# Patient Record
Sex: Male | Born: 1980 | Race: White | Hispanic: No | Marital: Single | Smoking: Current every day smoker
Health system: Southern US, Community
[De-identification: ages and names within clinical notes are randomized; demographics above are authoritative.]

## PROBLEM LIST (undated history)

## (undated) DIAGNOSIS — I10 Essential (primary) hypertension: Secondary | ICD-10-CM

## (undated) DIAGNOSIS — F419 Anxiety disorder, unspecified: Secondary | ICD-10-CM

## (undated) DIAGNOSIS — Z720 Tobacco use: Secondary | ICD-10-CM

## (undated) DIAGNOSIS — R000 Tachycardia, unspecified: Principal | ICD-10-CM

## (undated) HISTORY — PX: HERNIA REPAIR: SHX51

## (undated) HISTORY — DX: Essential (primary) hypertension: I10

## (undated) HISTORY — DX: Tobacco use: Z72.0

## (undated) HISTORY — DX: Anxiety disorder, unspecified: F41.9

## (undated) HISTORY — DX: Tachycardia, unspecified: R00.0

---

## 2016-05-29 ENCOUNTER — Encounter (HOSPITAL_COMMUNITY): Payer: Self-pay

## 2016-05-29 ENCOUNTER — Emergency Department (HOSPITAL_COMMUNITY)
Admission: EM | Admit: 2016-05-29 | Discharge: 2016-05-30 | Disposition: A | Discharge status: Home / Self Care | Payer: Managed Care, Other (non HMO) | Attending: Emergency Medicine | Admitting: Emergency Medicine

## 2016-05-29 DIAGNOSIS — Z79899 Other long term (current) drug therapy: Secondary | ICD-10-CM | POA: Diagnosis not present

## 2016-05-29 DIAGNOSIS — E860 Dehydration: Secondary | ICD-10-CM | POA: Diagnosis not present

## 2016-05-29 DIAGNOSIS — F17210 Nicotine dependence, cigarettes, uncomplicated: Secondary | ICD-10-CM | POA: Insufficient documentation

## 2016-05-29 DIAGNOSIS — R531 Weakness: Secondary | ICD-10-CM

## 2016-05-29 DIAGNOSIS — R55 Syncope and collapse: Secondary | ICD-10-CM | POA: Diagnosis present

## 2016-05-29 NOTE — ED Triage Notes (Signed)
Pt BIB [facility] from home. Pt reports he has been havin episodes of being dizzy, lightheaded, and weak. He reports going to his PCP on Thursday and they prescribed him Paxil. He reports he started this on Friday and feels like it has put him in a daze. He thinks he is having some anxiety. He reports what made him call EMS tonight was a near syncopal episode coming back from [place]. Denies cardiac history but reports he has felt his heart feeling "weird and fast". Opioid hx with current every day Suboxone use.

## 2016-05-29 NOTE — ED Provider Notes (Signed)
[facility]ENC[facility] DEPT Provider Note   CSN: [id number] Arrival date & time: 05/29/16  2249  By signing my name below, I, Elder Negus, attest that this documentation has been prepared under the direction and in the presence of Azalia Bilis, MD. Electronically Signed: Elder Negus, Scribe. 05/29/16. 11:41 PM.   History   Chief Complaint Chief Complaint  Patient presents with  . Near Syncope    HPI Nicholas Clark is a 36 y.o. male without any chronic medical problems who presents to the ED for evaluation of near syncope. This patient states that for the 7 days he has experienced "foggy thinking" (not disoriented) with occasional heart palpitations and paresthesias in the face, hands. No chest pain. Today, he was walking from the restroom when he became acutely lightheaded and "nearly passed out". He denies any loss of consciousness. No focal weaknesses or loss of sensation. No cough, vomiting, diarrhea. No rectal bleeding. No abdominal pain. He has had normal fluid intake recently. He denies any active lightheadedness at interview. He was started on Paxil 2 days ago for anxiety by primary care. Also takes suboxone.   The history is provided by the patient. No language interpreter was used.    History reviewed. No pertinent past medical history.  There are no active problems to display for this patient.   Past Surgical History:  Procedure Laterality Date  . HERNIA REPAIR     pt reports he was around 2 yr        Home Medications    Prior to Admission medications   Medication Sig Start Date End Date Taking? Authorizing Provider  PARoxetine (PAXIL-CR) 25 MG 24 hr tablet Take 25 mg by mouth daily. 05/26/16  Yes Historical Provider, MD  SUBOXONE 12-3 MG FILM Place 1 Film under the tongue daily.  05/24/16  Yes Historical Provider, MD    Family History No family history on file.  Social History Social History  Substance Use Topics  . Smoking status: Current Every Day  Smoker    Packs/day: 1.50    Years: 20.00    Types: Cigarettes  . Smokeless tobacco: Current User  . Alcohol use No     Allergies   Patient has no allergy information on record.   Review of Systems Review of Systems  All systems reviewed and are negative for acute change except as noted in the HPI.  Physical Exam Updated Vital Signs BP (!) 145/91 (BP Location: Left Arm)   Pulse 100   Temp 99.2 F (37.3 C) (Oral)   Resp 10   Ht  (1.778 m)   Wt 240 lb (108.9 kg)   SpO2 98%   BMI 34.44 kg/m   Physical Exam   ED Treatments / Results  Labs (all labs ordered are listed, but only abnormal results are displayed) Labs Reviewed  CBC - Abnormal; Notable for the following:       Result Value   WBC 13.0 (*)    All other components within normal limits  COMPREHENSIVE METABOLIC PANEL - Abnormal; Notable for the following:    Chloride 100 (*)    Glucose, Bld 114 (*)    Total Protein 6.4 (*)    All other components within normal limits  CBG MONITORING, ED - Abnormal; Notable for the following:    Glucose-Capillary 101 (*)    All other components within normal limits  TSH    EKG  EKG Interpretation  Date/Time:  Sunday May 29 2016 22:54:42 EDT Ventricular Rate:  105  PR Interval:    QRS Duration: 96 QT Interval:  327 QTC Calculation: 433 R Axis:   17 Text Interpretation:  Sinus tachycardia No old tracing to compare Confirmed by Pansy Ostrovsky  MD, Caryn Bee ([id number]) on 05/29/2016 11:30:29 PM       Radiology No results found.  Procedures Procedures (including critical care time)  Medications Ordered in ED Medications  sodium chloride 0.9 % bolus 1,000 mL (0 mLs Intravenous Stopped 05/30/16 0224)     Initial Impression / Assessment and Plan / ED Course  I have reviewed the triage vital signs and the nursing notes.  Pertinent labs & imaging results that were available during my care of the patient were reviewed by me and considered in my medical decision making  (see chart for details).     Patient feels better after IV fluids at this time.  No weakness of the arms or legs.  Doubt stroke.  Doubt ACS.  Doubt PE.  Well-appearing.  Discharge home with primary care follow-up.  Final Clinical Impressions(s) / ED Diagnoses   Final diagnoses:  Dehydration  Weakness    New Prescriptions New Prescriptions   No medications on file  I personally performed the services described in this documentation, which was scribed in my presence. The recorded information has been reviewed and is accurate.       Azalia Bilis, MD 05/30/16 [phone]

## 2016-05-30 LAB — COMPREHENSIVE METABOLIC PANEL
ALBUMIN: 3.9 g/dL (ref 3.5–5.0)
ALT: 22 U/L (ref 17–63)
ANION GAP: 9 (ref 5–15)
AST: 17 U/L (ref 15–41)
Alkaline Phosphatase: 49 U/L (ref 38–126)
BUN: 14 mg/dL (ref 6–20)
CHLORIDE: 100 mmol/L — AB (ref 101–111)
CO2: 27 mmol/L (ref 22–32)
Calcium: 9 mg/dL (ref 8.9–10.3)
Creatinine, Ser: 1.08 mg/dL (ref 0.61–1.24)
GFR calc Af Amer: >60 mL/min (ref >60)
GFR calc non Af Amer: >60 mL/min (ref >60)
GLUCOSE: 114 mg/dL — AB (ref 65–99)
POTASSIUM: 4 mmol/L (ref 3.5–5.1)
Sodium: 136 mmol/L (ref 135–145)
TOTAL PROTEIN: 6.4 g/dL — AB (ref 6.5–8.1)
Total Bilirubin: 0.4 mg/dL (ref 0.3–1.2)

## 2016-05-30 LAB — CBC
HEMATOCRIT: 42.4 % (ref 39.0–52.0)
HEMOGLOBIN: 14.4 g/dL (ref 13.0–17.0)
MCH: 28.1 pg (ref 26.0–34.0)
MCHC: 34 g/dL (ref 30.0–36.0)
MCV: 82.7 fL (ref 78.0–100.0)
PLATELETS: 217 10*3/uL (ref 150–400)
RBC: 5.13 MIL/uL (ref 4.22–5.81)
RDW: 12.9 % (ref 11.5–15.5)
WBC: 13 10*3/uL — AB (ref 4.0–10.5)

## 2016-05-30 LAB — CBG MONITORING, ED: Glucose-Capillary: 101 mg/dL — ABNORMAL HIGH (ref 65–99)

## 2016-05-30 LAB — TSH: TSH: 1.158 u[IU]/mL (ref 0.350–4.500)

## 2016-05-30 MED ORDER — SODIUM CHLORIDE 0.9 % IV BOLUS (SEPSIS)
1000.0000 mL | Freq: Once | INTRAVENOUS | Status: AC
  Administered 2016-05-30: 1000 mL via INTRAVENOUS

## 2016-05-30 NOTE — ED Notes (Signed)
Pt stated he did get a little dizzy going from sitting to standing but pt did well sitting and standing.

## 2016-05-30 NOTE — Discharge Instructions (Signed)
Please contact your doctor for additional recommendations

## 2016-06-09 ENCOUNTER — Other Ambulatory Visit: Payer: Self-pay

## 2016-06-09 ENCOUNTER — Encounter (HOSPITAL_COMMUNITY): Payer: Self-pay

## 2016-06-09 ENCOUNTER — Emergency Department (HOSPITAL_COMMUNITY)
Admission: EM | Admit: 2016-06-09 | Discharge: 2016-06-10 | Disposition: A | Discharge status: Home / Self Care | Payer: Managed Care, Other (non HMO) | Attending: Emergency Medicine | Admitting: Emergency Medicine

## 2016-06-09 ENCOUNTER — Emergency Department (HOSPITAL_COMMUNITY): Payer: Managed Care, Other (non HMO)

## 2016-06-09 DIAGNOSIS — E860 Dehydration: Secondary | ICD-10-CM | POA: Insufficient documentation

## 2016-06-09 DIAGNOSIS — F17210 Nicotine dependence, cigarettes, uncomplicated: Secondary | ICD-10-CM | POA: Diagnosis not present

## 2016-06-09 DIAGNOSIS — Z79899 Other long term (current) drug therapy: Secondary | ICD-10-CM | POA: Diagnosis not present

## 2016-06-09 DIAGNOSIS — R002 Palpitations: Secondary | ICD-10-CM | POA: Diagnosis present

## 2016-06-09 LAB — I-STAT TROPONIN, ED: TROPONIN I, POC: 0 ng/mL (ref 0.00–0.08)

## 2016-06-09 LAB — BASIC METABOLIC PANEL
Anion gap: 12 (ref 5–15)
BUN: 17 mg/dL (ref 6–20)
CALCIUM: 9.8 mg/dL (ref 8.9–10.3)
CO2: 29 mmol/L (ref 22–32)
CREATININE: 1.35 mg/dL — AB (ref 0.61–1.24)
Chloride: 93 mmol/L — ABNORMAL LOW (ref 101–111)
GFR calc Af Amer: >60 mL/min (ref >60)
GLUCOSE: 159 mg/dL — AB (ref 65–99)
Potassium: 3.1 mmol/L — ABNORMAL LOW (ref 3.5–5.1)
Sodium: 134 mmol/L — ABNORMAL LOW (ref 135–145)

## 2016-06-09 LAB — CBC
HCT: 45.4 % (ref 39.0–52.0)
Hemoglobin: 15.9 g/dL (ref 13.0–17.0)
MCH: 28.5 pg (ref 26.0–34.0)
MCHC: 35 g/dL (ref 30.0–36.0)
MCV: 81.5 fL (ref 78.0–100.0)
PLATELETS: 211 10*3/uL (ref 150–400)
RBC: 5.57 MIL/uL (ref 4.22–5.81)
RDW: 12.6 % (ref 11.5–15.5)
WBC: 10.6 10*3/uL — ABNORMAL HIGH (ref 4.0–10.5)

## 2016-06-09 MED ORDER — POTASSIUM CHLORIDE CRYS ER 20 MEQ PO TBCR
40.0000 meq | EXTENDED_RELEASE_TABLET | Freq: Once | ORAL | Status: AC
Start: 2016-06-09 — End: 2016-06-09
  Administered 2016-06-09: 40 meq via ORAL
  Filled 2016-06-09: qty 2

## 2016-06-09 MED ORDER — SODIUM CHLORIDE 0.9 % IV BOLUS (SEPSIS)
1000.0000 mL | Freq: Once | INTRAVENOUS | Status: AC
  Administered 2016-06-10: 1000 mL via INTRAVENOUS

## 2016-06-09 NOTE — ED Provider Notes (Signed)
[facility] Provider Note   CSN: [id number] Arrival date & time: 06/09/16  2134     History   Chief Complaint Chief Complaint  Patient presents with  . Chest Pain    HPI Nicholas Clark is a 36 y.o. male.  Patient with PMH of anxiety, palpitations, and recovering from drug abuse presents to the ED with a chief complaint of palpitations.  He states that for the past several weeks he has had intermittent palpitations.  He associates it with eating.  He states that he has some pain that radiates to bilateral shoulders.  He denies any associated fevers, chills, cough, or SOB.  He states that he has been seen by his PCP and was started on metoprolol, klonopin, and HCTZ.  He is attempting to schedule an appointment with the cardiologist.    The history is provided by the patient. No language interpreter was used.    History reviewed. No pertinent past medical history.  There are no active problems to display for this patient.   Past Surgical History:  Procedure Laterality Date  . HERNIA REPAIR     pt reports he was around 2 yr        Home Medications    Prior to Admission medications   Medication Sig Start Date End Date Taking? Authorizing Provider  PARoxetine (PAXIL-CR) 25 MG 24 hr tablet Take 25 mg by mouth daily. 05/26/16   Historical Provider, MD  SUBOXONE 12-3 MG FILM Place 1 Film under the tongue daily.  05/24/16   Historical Provider, MD    Family History No family history on file.  Social History Social History  Substance Use Topics  . Smoking status: Current Every Day Smoker    Packs/day: 1.50    Years: 20.00    Types: Cigarettes  . Smokeless tobacco: Current User  . Alcohol use No     Allergies   Patient has no known allergies.   Review of Systems Review of Systems  All other systems reviewed and are negative.    Physical Exam Updated Vital Signs BP (!) 137/97 (BP Location: Left Arm)   Pulse (!) 118   Temp 98.6 F (37 C)   Resp 18    SpO2 99%   Physical Exam  Constitutional: He is oriented to person, place, and time. He appears well-developed and well-nourished.  HENT:  Head: Normocephalic and atraumatic.  Eyes: Conjunctivae and EOM are normal. Pupils are equal, round, and reactive to light. Right eye exhibits no discharge. Left eye exhibits no discharge. No scleral icterus.  Neck: Normal range of motion. Neck supple. No JVD present.  Cardiovascular: Normal rate, regular rhythm and normal heart sounds.  Exam reveals no gallop and no friction rub.   No murmur heard. tachycardic  Pulmonary/Chest: Effort normal and breath sounds normal. No respiratory distress. He has no wheezes. He has no rales. He exhibits no tenderness.  Abdominal: Soft. He exhibits no distension and no mass. There is no tenderness. There is no rebound and no guarding.  Musculoskeletal: Normal range of motion. He exhibits no edema or tenderness.  No leg swelling or tenderness  Neurological: He is alert and oriented to person, place, and time.  Skin: Skin is warm and dry.  Psychiatric: He has a normal mood and affect. His behavior is normal. Judgment and thought content normal.  Nursing note and vitals reviewed.    ED Treatments / Results  Labs (all labs ordered are listed, but only abnormal results are displayed) Labs Reviewed  BASIC METABOLIC PANEL - Abnormal; Notable for the following:       Result Value   Sodium 134 (*)    Potassium 3.1 (*)    Chloride 93 (*)    Glucose, Bld 159 (*)    Creatinine, Ser 1.35 (*)    All other components within normal limits  CBC - Abnormal; Notable for the following:    WBC 10.6 (*)    All other components within normal limits  D-DIMER, QUANTITATIVE (NOT AT [facility])  Rosezena Sensor, ED    EKG  EKG Interpretation  Date/Time:  Thursday Jun 09 2016 23:44:55 EDT Ventricular Rate:  90 PR Interval:    QRS Duration: 103 QT Interval:  342 QTC Calculation: 419 R Axis:   -3 Text Interpretation:  Sinus  rhythm No significant change since last tracing Confirmed by Zadie Rhine ([id number]) on 06/10/2016 12:07:45 AM       Radiology Dg Chest 2 View  Result Date: 06/09/2016 CLINICAL DATA:  Intermittent chest pain tonight, radiating down the left arm. EXAM: CHEST  2 VIEW COMPARISON:  None. FINDINGS: The lungs are clear. The pulmonary vasculature is normal. Heart size is normal. Hilar and mediastinal contours are unremarkable. There is no pleural effusion. IMPRESSION: No active cardiopulmonary disease. Electronically Signed   By: Ellery Plunk M.D.   On: 06/09/2016 22:15    Procedures Procedures (including critical care time)  Medications Ordered in ED Medications  sodium chloride 0.9 % bolus 1,000 mL (not administered)  potassium chloride SA (K-DUR,KLOR-CON) CR tablet 40 mEq (40 mEq Oral Given 06/09/16 2331)     Initial Impression / Assessment and Plan / ED Course  I have reviewed the triage vital signs and the nursing notes.  Pertinent labs & imaging results that were available during my care of the patient were reviewed by me and considered in my medical decision making (see chart for details).     Patient with intermittent palpitations.  Unclear etiology.  Recently seen for the same.  TSH normal.  Started on metoprolol by PCP with some relief, but not complete.  Has discontinued stimulants such as caffeine.  Is working on getting cardiology follow-up.  Laboratory workup is reassuring. D-dimer is negative. Troponin is normal. Potassium repleted.  Suspect some element of dehydration.  Feels improved after a liter of fluids.  Will discharge to home with cardiology follow-up.  Patient understands and agrees with the plan.  Final Clinical Impressions(s) / ED Diagnoses   Final diagnoses:  Palpitations  Dehydration    New Prescriptions Discharge Medication List as of 06/10/2016  2:50 AM       Roxy Horseman, PA-C 06/10/16 2952    Geoffery Lyons, MD 06/10/16 [phone]

## 2016-06-09 NOTE — ED Triage Notes (Signed)
Pt reports left sided chest pain with heart palpitations ongoing for a couple of days and unable to get an appt with a cardiologist. He reports feeling dizziness but has not passed out. He just started taking Metoprolol on Tuesday by PCP. HR 118 at triage.

## 2016-06-10 LAB — D-DIMER, QUANTITATIVE: D-Dimer, Quant: 0.41 ug/mL-FEU (ref 0.00–0.50)

## 2016-06-10 NOTE — ED Notes (Signed)
No chest pain

## 2016-06-10 NOTE — ED Notes (Signed)
No pain

## 2016-06-10 NOTE — ED Notes (Signed)
Pt ambulated steady down the hallway, pt states he "felt a touch dizzy" pt ambulated independently and denied SOB

## 2016-06-13 ENCOUNTER — Telehealth: Payer: Self-pay | Admitting: Cardiovascular Disease

## 2016-06-13 NOTE — Telephone Encounter (Signed)
lmsg for pt to call.  Need to confirm his appt for tomorrow with Dr. Duke Salviaandolph.  Did not leave detailed message.  No DPR on file.

## 2016-06-14 ENCOUNTER — Encounter: Payer: Self-pay | Admitting: Cardiovascular Disease

## 2016-06-14 ENCOUNTER — Ambulatory Visit (INDEPENDENT_AMBULATORY_CARE_PROVIDER_SITE_OTHER): Payer: Managed Care, Other (non HMO) | Admitting: Cardiovascular Disease

## 2016-06-14 VITALS — BP 124/72 | HR 128 | Ht 70.0 in | Wt 230.0 lb

## 2016-06-14 DIAGNOSIS — Z720 Tobacco use: Secondary | ICD-10-CM

## 2016-06-14 DIAGNOSIS — F419 Anxiety disorder, unspecified: Secondary | ICD-10-CM | POA: Diagnosis not present

## 2016-06-14 DIAGNOSIS — I10 Essential (primary) hypertension: Secondary | ICD-10-CM | POA: Diagnosis not present

## 2016-06-14 DIAGNOSIS — R000 Tachycardia, unspecified: Secondary | ICD-10-CM | POA: Diagnosis not present

## 2016-06-14 HISTORY — DX: Essential (primary) hypertension: I10

## 2016-06-14 HISTORY — DX: Tachycardia, unspecified: R00.0

## 2016-06-14 HISTORY — DX: Tobacco use: Z72.0

## 2016-06-14 HISTORY — DX: Anxiety disorder, unspecified: F41.9

## 2016-06-14 MED ORDER — LISINOPRIL 20 MG PO TABS: 20.0000 mg | ORAL_TABLET | Freq: Every day | ORAL | 5 refills | Status: DC

## 2016-06-14 MED ORDER — METOPROLOL TARTRATE 50 MG PO TABS: 50.0000 mg | ORAL_TABLET | Freq: Two times a day (BID) | ORAL | 5 refills | Status: DC

## 2016-06-14 NOTE — Patient Instructions (Addendum)
Medication Instructions:  STOP HYDROCHLOROTHIAZIDE   INCREASE YOUR METOPROLOL TO 50 MG TWICE A DAY   START LISINOPRIL 20 MG DAILY   Labwork: NONE  Testing/Procedures: NONE  Follow-Up: Your physician recommends that you schedule a follow-up appointment in: 3 WEEKS   If you need a refill on your cardiac medications before your next appointment, please call your pharmacy.

## 2016-06-14 NOTE — Progress Notes (Signed)
Cardiology Office Note   Date:  06/14/2016   ID:  Nicholas Clark, DOB Feb 10, 1980, MRN [id number]  PCP:  System, Pcp Not In  Cardiologist:   Chilton Si, MD   Chief Complaint  Patient presents with  . New Patient (Initial Visit)    high blood pressure, high pulse and tightness in chest, having some dizziness      History of Present Illness: Nicholas Clark is a 36 y.o. male with anxiety, palpitations and prior drug abuse who presents for an evaluation of palpitations at the request of Nicholas Clark.  Nicholas Clark reports three weeks of palpitations.  Prior to that he was started on Paxil for anxiety.  However, he had to stop it because of side effects.  Two days later he was seen in the ED with near syncope on 05/29/16.  BP at the time was 145/91 and heart rate was 100 bpm.  He received 1 L of IV fluid and followed up with his PCP. At that time he was noted to be hypertensive and was started on hydrochlorothiazide. He followed up and was hypokalemic and reported palpitations. He was started on metoprolol 25 mg twice daily.  However he was again seen in the ED 06/09/16 with palpitations.  EKG revealed sinus tachycardia at 126 bpm and QTc 579 ms.  Cardiac enzymes, TSH and d-dimer were normal.  He received 1L IVF and was discharged with plans for follow up with cardiology as an outpatient.  Since then his BP at home has been 97-125/67-84 and his heart rate has ranged from 72-111. He thinks that the metoprolol is helping. He also thinks that caffeine as a trigger for his palpitations.  Nicholas Clark has noted fast heart rates in the past, but it typically occurs when he is stressed or anxious.  For the last few weeks it starts unprovoked.  He stop drinking caffeine, limited salt and sugar intake but has not noted any improvement in the symptoms.  The palpitations are associated with lightheadedness and he sometimes feels like he is going to pass out.  There is no associated shortness of  breath, but he sometimes get a momentary dull pain in his chest.    Nicholas Clark does not get any formal exercise. However he works at [employer] and does a lot of walking and climbing ladders. He has no exertional symptoms.  He smokes one pack of cigarettes daily and is ready to quit.  He hasn't ever tried in the past.  He denies alcohol or illicit drug use.  He doesn't use any OTC cold or cough medication.   Past Medical History:  Diagnosis Date  . Anxiety 06/14/2016  . Essential hypertension 06/14/2016  . Sinus tachycardia 06/14/2016  . Tobacco abuse 06/14/2016    Past Surgical History:  Procedure Laterality Date  . HERNIA REPAIR     pt reports he was around 2 yr      Current Outpatient Prescriptions  Medication Sig Dispense Refill  . clonazePAM (KLONOPIN) 0.5 MG tablet Take 1 tablet by mouth as needed for anxiety.     . metoprolol tartrate (LOPRESSOR) 50 MG tablet Take 1 tablet (50 mg total) by mouth 2 (two) times daily. 60 tablet 5  . SUBOXONE 12-3 MG FILM Place 1 Film under the tongue daily.     Marland Kitchen lisinopril (PRINIVIL,ZESTRIL) 20 MG tablet Take 1 tablet (20 mg total) by mouth daily. 30 tablet 5   No current facility-administered medications for this visit.  Allergies:   Patient has no known allergies.    Social History:  The patient  reports that he has been smoking Cigarettes.  He has a 30.00 pack-year smoking history. He uses smokeless tobacco. He reports that he does not drink alcohol or use drugs.   Family History:  The patient's family history includes Arthritis in his father; Autoimmune disease in his sister; Cancer in his paternal grandmother; Emphysema in his maternal grandfather; Lupus in his sister.    ROS:  Please see the history of present illness.   Otherwise, review of systems are positive for none.   All other systems are reviewed and negative.    PHYSICAL EXAM: VS:  BP 124/72   Pulse (!) 128   Ht 5\' 10"  (1.778 m)   Wt 104.3 kg (230 lb)   BMI  33.00 kg/m  , BMI Body mass index is 33 kg/m. GENERAL:  Well appearing.  No acute distress.  Anxious and diaphoretic. HEENT:  Pupils equal round and reactive, fundi not visualized, oral mucosa unremarkable NECK:  No jugular venous distention, waveform within normal limits, carotid upstroke brisk and symmetric, no bruits, no thyromegaly LYMPHATICS:  No cervical adenopathy LUNGS:  Clear to auscultation bilaterally HEART:  Tachycardic.  Regular rhythm.  PMI not displaced or sustained,S1 and S2 within normal limits, no S3, no S4, no clicks, no rubs, no murmurs ABD:  Flat, positive bowel sounds normal in frequency in pitch, no bruits, no rebound, no guarding, no midline pulsatile mass, no hepatomegaly, no splenomegaly EXT:  2 plus pulses throughout, no edema, no cyanosis no clubbing SKIN:  No rashes no nodules NEURO:  Cranial nerves II through XII grossly intact, motor grossly intact throughout PSYCH:  Cognitively intact, oriented to person place and time.     EKG:  EKG is not ordered today. The ekg ordered 06/09/16 demonstrates sinus tachycardia. Rate 126 bpm.   Recent Labs: 05/29/2016: ALT 22; TSH 1.158 06/09/2016: BUN 17; Creatinine, Ser 1.35; Hemoglobin 15.9; Platelets 211; Potassium 3.1; Sodium 134    Lipid Panel No results found for: CHOL, TRIG, HDL, CHOLHDL, VLDL, LDLCALC, LDLDIRECT    Wt Readings from Last 3 Encounters:  06/14/16 104.3 kg (230 lb)  05/29/16 108.9 kg (240 lb)      ASSESSMENT AND PLAN:  # Sinus tachycardia: # Hypertension: Nicholas Clark's sinus tachycardia is likely multifactorial. It seems to have improved since stopping caffeine. When he initially said down in the office his heart rate was 150. This improved to the 120s with rest. I suspect that some of this is triggered by anxiety. His heart rates are much better controlled when he checks it at home. Metoprolol seems to be helping but it remains uncontrolled. We will increase to 50 mg twice daily implanted  consolidate to succinate at this dose works well. There is also likely a component of intravascular volume depletion. He works outside a lot and was started on hydrochlorothiazide. This is also caused his potassium to be low. We will stop HCTZ and start lisinopril 20 mg daily.  Repeat BMP at follow up.  D-dimer, TSH, h/h and renal function are otherwise normal.  Consider echo at follow up.   # Tobacco abuse: Mr. Temple PaciniFiorentino is ready to stop smoking. I do not recommend that he use Chantix or Wellbutrin given his psychiatric history. He will start nicotine replacement patches. We discussed smoking cessation for 5 minutes.  Current medicines are reviewed at length with the patient today.  The patient does not have concerns  regarding medicines.  The following changes have been made:  Stop HCTZ and increase metoprolol to 50 mg bid.  Start lisinopril 20 mg daily.   Labs/ tests ordered today include:  No orders of the defined types were placed in this encounter.    Disposition:   FU with Anvita Hirata C. Duke Salvia, MD, [facility] in 2-3 weeks.     This note was written with the assistance of speech recognition software.  Please excuse any transcriptional errors.  Signed, Yotam Rhine C. Duke Salvia, MD, [facility]  06/14/2016 12:56 PM    [facility]

## 2016-06-27 ENCOUNTER — Telehealth: Payer: Self-pay | Admitting: Cardiovascular Disease

## 2016-06-27 MED ORDER — METOPROLOL TARTRATE 25 MG PO TABS: 25.0000 mg | ORAL_TABLET | ORAL | Status: DC | PRN

## 2016-06-27 NOTE — Telephone Encounter (Signed)
Returned the call to the patient. He stated that his blood pressure today was 85/58 with a heart rate of 75. He had taken his Metoprolol 1 hour before. He stated that on Friday his blood pressure was 83/52. He held his Lisinopril 20 mg and decreased his Metoprolol to 25 mg twice daily. Per Dr. Duke Salviaandolph, the patient should take the Metoprolol as needed and discontinue the Lisinopril. The patient was instructed to keep taking his blood pressure and to let the clinic know if he had to start taking the Metoprolol again. He verbalized his understanding.

## 2016-06-27 NOTE — Telephone Encounter (Signed)
Pt's blood pressure is extremely low,last reading today it was 85/58.Pllease call asap to advise..Marland Kitchen

## 2016-06-29 ENCOUNTER — Telehealth: Payer: Self-pay | Admitting: Cardiovascular Disease

## 2016-06-29 NOTE — Telephone Encounter (Signed)
Spoke with patient and stated that at times he feels really lightheaded especially when he goes from a sitting to a standing position. Does not check his blood pressure when he feels lightheaded. This am blood pressure 98/65 and most recent blood pressure 112/70's . Last Metoprolol dose was last night. Advised to start checking blood pressure during these times and will make sure Dr Duke Salviaandolph aware before adding Toprol  He stated he was also concerned with the random numbness in legs and arms that just started about a week ago.  Will forward to Dr Duke Salviaandolph for review

## 2016-06-29 NOTE — Telephone Encounter (Signed)
Returned call to patient.He stated when he got out of shower last night he had a fast heart beat,132.He took Metoprolol 25 mg.and heart beat slowed down.Stated Dr.V[place] wanted to know when he had fast heart beat.Stated he also has been having random numbness in lower legs and arms.He has occasional chest pain.Stated he feels good this morning B/P 98/65 pulse 67.Stated he would like to know what Dr.[place] advises.

## 2016-06-29 NOTE — Telephone Encounter (Signed)
Lets try metoprolol succinate 12.5mg  daily

## 2016-06-29 NOTE — Telephone Encounter (Signed)
Patient father calling, states that patient was advised to stop taking medication but then last night patient's heart rate was 132 and so patient took 25 mg of metoprolol. Patient father states that he was instructed to call if there were any changes. Please call to discuss, thanks.

## 2016-06-30 MED ORDER — METOPROLOL TARTRATE 25 MG PO TABS: 25.0000 mg | ORAL_TABLET | ORAL | 1 refills | Status: AC | PRN

## 2016-06-30 NOTE — Telephone Encounter (Signed)
Follow up    *STAT* If patient is at the pharmacy, call can be transferred to refill team.   1. Which medications need to be refilled? (please list name of each medication and dose if known)  metoprolol tartrate (LOPRESSOR) 25 MG tablet Take 1 tablet (25 mg total) by mouth as needed.     2. Which pharmacy/location (including street and city if local pharmacy) is medication to be sent to? [facility]   3. Do they need a 30 day or 90 day supply? 90

## 2016-06-30 NOTE — Telephone Encounter (Signed)
Rx has been sent to the pharmacy electronically. ° °

## 2016-07-08 ENCOUNTER — Ambulatory Visit (INDEPENDENT_AMBULATORY_CARE_PROVIDER_SITE_OTHER): Payer: Managed Care, Other (non HMO) | Admitting: Cardiovascular Disease

## 2016-07-08 ENCOUNTER — Encounter: Payer: Self-pay | Admitting: Cardiovascular Disease

## 2016-07-08 VITALS — BP 132/94 | HR 138 | Ht 70.0 in | Wt 232.4 lb

## 2016-07-08 DIAGNOSIS — R000 Tachycardia, unspecified: Secondary | ICD-10-CM | POA: Diagnosis not present

## 2016-07-08 DIAGNOSIS — I10 Essential (primary) hypertension: Secondary | ICD-10-CM | POA: Diagnosis not present

## 2016-07-08 DIAGNOSIS — F419 Anxiety disorder, unspecified: Secondary | ICD-10-CM | POA: Diagnosis not present

## 2016-07-08 MED ORDER — SERTRALINE HCL 50 MG PO TABS: 50.0000 mg | ORAL_TABLET | Freq: Every day | ORAL | 3 refills | Status: DC

## 2016-07-08 NOTE — Progress Notes (Signed)
Cardiology Office Note   Date:  07/08/2016   ID:  Nussen Pullin, DOB 1980-09-17, MRN [id number]  PCP:  System, Pcp Not In  Cardiologist:   Chilton Si, MD   Chief Complaint  Patient presents with  . Follow-up    feels dizzy at times, worse when going from sitting to standing position       History of Present Illness: Jontrell Bushong is a 36 y.o. male with anxiety, palpitations and prior drug abuse who presents for follow up.  He was initally seen for palpitations 06/2016.  Prior to that he was started on Paxil for anxiety.  However, he had to stop it because of side effects.  Two days later he was seen in the ED with near syncope on 05/29/16.  BP at the time was 145/91 and heart rate was 100 bpm.  He received 1 L of IV fluid and followed up with his PCP. At that time he was noted to be hypertensive and was started on hydrochlorothiazide. He followed up and was hypokalemic and reported palpitations. He was started on metoprolol 25 mg twice daily.  However he was again seen in the ED 06/09/16 with palpitations.  EKG revealed sinus tachycardia at 126 bpm and QTc 579 ms.  Cardiac enzymes, TSH and d-dimer were normal.  He received 1L IVF and was discharged with plans for follow up with cardiology as an outpatient.  At his clinic appointment his heart rate was initially 150 and it improved to 120 bpm with rest.  Metoprolol was increased and HCTZ was discontinued due to concern for volume depletion.  He was started on lisinopril instead.  His BP was then low so lisinopril was discontinued.  His blood pressure at home continues to be low.  He took metoprolol three days in a row because his heart was racing.  He notes that he has been feeling very anxious.  He takes clonazepam prn, which helps somewhat.  In the past he took Zoloft which was very helpful.  He self-discontinued this medicine several years ago.  Since then he took Paxil but this made him more anxious and gave him insomnia.  He denies chest  pain or shortness of breath.  He also denies lower extremity edema, orthopnea or PND.   Past Medical History:  Diagnosis Date  . Anxiety 06/14/2016  . Essential hypertension 06/14/2016  . Sinus tachycardia 06/14/2016  . Tobacco abuse 06/14/2016    Past Surgical History:  Procedure Laterality Date  . HERNIA REPAIR     pt reports he was around 2 yr      Current Outpatient Prescriptions  Medication Sig Dispense Refill  . clonazePAM (KLONOPIN) 0.5 MG tablet Take 1 tablet by mouth as needed for anxiety.     . metoprolol tartrate (LOPRESSOR) 25 MG tablet Take 1 tablet (25 mg total) by mouth as needed. 90 tablet 1  . SUBOXONE 12-3 MG FILM Place 1 Film under the tongue daily.     . sertraline (ZOLOFT) 50 MG tablet Take 1 tablet (50 mg total) by mouth daily. 30 tablet 3   No current facility-administered medications for this visit.     Allergies:   Patient has no known allergies.    Social History:  The patient  reports that he has been smoking Cigarettes.  He has a 30.00 pack-year smoking history. He uses smokeless tobacco. He reports that he does not drink alcohol or use drugs.   Family History:  The patient's family history includes  Arthritis in his father; Autoimmune disease in his sister; Cancer in his paternal grandmother; Emphysema in his maternal grandfather; Lupus in his sister.    ROS:  Please see the history of present illness.   Otherwise, review of systems are positive for sinus congestion, leg and arm numbness.   All other systems are reviewed and negative.    PHYSICAL EXAM: VS:  BP (!) 132/94 (BP Location: Right Arm, Cuff Size: Large)   Pulse (!) 138   Ht 5\' 10"  (1.778 m)   Wt 105.4 kg (232 lb 6.4 oz)   BMI 33.35 kg/m  , BMI Body mass index is 33.35 kg/m. GENERAL:  Well appearing.  No acute distress.  Anxious and diaphoretic. HEENT:  Pupils equal round and reactive, fundi not visualized, oral mucosa unremarkable NECK:  No jugular venous distention, waveform within  normal limits, carotid upstroke brisk and symmetric, no bruits, no thyromegaly LYMPHATICS:  +bilateral cervical adenopathy LUNGS:  Clear to auscultation bilaterally HEART:  Tachycardic.  Regular rhythm.  PMI not displaced or sustained,S1 and S2 within normal limits, no S3, no S4, no clicks, no rubs, no murmurs ABD:  Flat, positive bowel sounds normal in frequency in pitch, no bruits, no rebound, no guarding, no midline pulsatile mass, no hepatomegaly, no splenomegaly EXT:  2 plus pulses throughout, no edema, no cyanosis no clubbing SKIN:  No rashes no nodules NEURO:  Cranial nerves II through XII grossly intact, motor grossly intact throughout PSYCH:  Cognitively intact, oriented to person place and time.     EKG:  EKG is ordered today. The ekg ordered 06/09/16 demonstrates sinus tachycardia. Rate 126 bpm. 07/08/16: Sinus tachycardia.  Rate 135 bpm  Recent Labs: 05/29/2016: ALT 22; TSH 1.158 06/09/2016: BUN 17; Creatinine, Ser 1.35; Hemoglobin 15.9; Platelets 211; Potassium 3.1; Sodium 134    Lipid Panel No results found for: CHOL, TRIG, HDL, CHOLHDL, VLDL, LDLCALC, LDLDIRECT    Wt Readings from Last 3 Encounters:  07/08/16 105.4 kg (232 lb 6.4 oz)  06/14/16 104.3 kg (230 lb)  05/29/16 108.9 kg (240 lb)      ASSESSMENT AND PLAN:  # Sinus tachycardia: # Hypertension: Mr. Wilbert's sinus tachycardia seems to be due to anxiety.  When he takes BP medication for his symptoms he becomes hypotensive.  In the past Zoloft was helpful.  He will start Zoloft 50 mg daily and follow up with his PCP.  He can continue to take metoprolol as needed for symptomatic tachycardia.  I suspect that he won't need it once his anxiety is better controlled.    Current medicines are reviewed at length with the patient today.  The patient does not have concerns regarding medicines.  The following changes have been made:  Stop lisinopril.  Start Zoloft 50 mg daily.  Labs/ tests ordered today include:    Orders Placed This Encounter  Procedures  . EKG 12-Lead     Disposition:   FU with Dallis Darden C. Duke Salviaandolph, MD, [facility] in 3 months.   This note was written with the assistance of speech recognition software.  Please excuse any transcriptional errors.  Signed, Galen Malkowski C. Duke Salviaandolph, MD, [facility]  07/08/2016 11:20 PM    [facility]

## 2016-07-08 NOTE — Telephone Encounter (Signed)
Patient seen in clinic today. Will continue using Metoprolol on an as needed basis per Dr Duke Salviaandolph

## 2016-07-08 NOTE — Patient Instructions (Addendum)
Medication Instructions:  START ZOLOFT (SETRALINE) 50 MG DAILY  CONTINUE TO USE METOPROLOL AS NEEDED  Labwork: NONE  Testing/Procedures: NONE  Follow-Up: Your physician recommends that you schedule a follow-up appointment in: 1 MONTH WITH YOUR PRIMARY CARE  Your physician recommends that you schedule a follow-up appointment in: 3 MONTHS [facility]   If you need a refill on your cardiac medications before your next appointment, please call your pharmacy.

## 2016-07-10 ENCOUNTER — Encounter (HOSPITAL_COMMUNITY): Payer: Self-pay | Admitting: Emergency Medicine

## 2016-07-10 ENCOUNTER — Emergency Department (HOSPITAL_COMMUNITY): Payer: Managed Care, Other (non HMO)

## 2016-07-10 ENCOUNTER — Emergency Department (HOSPITAL_COMMUNITY)
Admission: EM | Admit: 2016-07-10 | Discharge: 2016-07-10 | Disposition: A | Discharge status: Home / Self Care | Payer: Managed Care, Other (non HMO) | Attending: Emergency Medicine | Admitting: Emergency Medicine

## 2016-07-10 DIAGNOSIS — F17210 Nicotine dependence, cigarettes, uncomplicated: Secondary | ICD-10-CM | POA: Diagnosis not present

## 2016-07-10 DIAGNOSIS — M79601 Pain in right arm: Secondary | ICD-10-CM

## 2016-07-10 DIAGNOSIS — R0789 Other chest pain: Secondary | ICD-10-CM | POA: Diagnosis not present

## 2016-07-10 DIAGNOSIS — I10 Essential (primary) hypertension: Secondary | ICD-10-CM | POA: Insufficient documentation

## 2016-07-10 LAB — CBC
HEMATOCRIT: 47.2 % (ref 39.0–52.0)
Hemoglobin: 16.1 g/dL (ref 13.0–17.0)
MCH: 28.6 pg (ref 26.0–34.0)
MCHC: 34.1 g/dL (ref 30.0–36.0)
MCV: 84 fL (ref 78.0–100.0)
Platelets: 241 10*3/uL (ref 150–400)
RBC: 5.62 MIL/uL (ref 4.22–5.81)
RDW: 13.2 % (ref 11.5–15.5)
WBC: 9.5 10*3/uL (ref 4.0–10.5)

## 2016-07-10 LAB — BASIC METABOLIC PANEL
Anion gap: 10 (ref 5–15)
BUN: 12 mg/dL (ref 6–20)
CHLORIDE: 102 mmol/L (ref 101–111)
CO2: 26 mmol/L (ref 22–32)
Calcium: 9.8 mg/dL (ref 8.9–10.3)
Creatinine, Ser: 1.16 mg/dL (ref 0.61–1.24)
GFR calc Af Amer: >60 mL/min (ref >60)
GFR calc non Af Amer: >60 mL/min (ref >60)
GLUCOSE: 131 mg/dL — AB (ref 65–99)
POTASSIUM: 3.9 mmol/L (ref 3.5–5.1)
Sodium: 138 mmol/L (ref 135–145)

## 2016-07-10 LAB — I-STAT TROPONIN, ED: Troponin i, poc: 0 ng/mL (ref 0.00–0.08)

## 2016-07-10 NOTE — Discharge Instructions (Signed)
Follow up with cardiology in your primary doctor as discussed.  If you were given medicines take as directed.  If you are on coumadin or contraceptives realize their levels and effectiveness is altered by many different medicines.  If you have any reaction (rash, tongues swelling, other) to the medicines stop taking and see a physician.    If your blood pressure was elevated in the ER make sure you follow up for management with a primary doctor or return for chest pain, shortness of breath or stroke symptoms.  Please follow up as directed and return to the ER or see a physician for new or worsening symptoms.  Thank you. Vitals:   07/10/16 1543 07/10/16 1700  BP: 130/89 (!) 135/96  Pulse: (!) 120 90  Resp: 18 18  Temp: 98.3 F (36.8 C)   TempSrc: Oral   SpO2: 99% 96%  Weight: 105.2 kg (232 lb)   Height: 5\' 10"  (1.778 m)

## 2016-07-10 NOTE — ED Provider Notes (Signed)
[facility]-EMERGENCY DEPT Provider Note   CSN: [id number] Arrival date & time: 07/10/16  1519     History   Chief Complaint Chief Complaint  Patient presents with  . Chest Pain  . Arm Pain    HPI Nicholas Clark is a 36 y.o. male.  Patient presents with right upper arm weird sensation and mild right upper chest discomfort since last night. Patient's had this multiple times and has been anxious. Patient has cardiology and primary care following. Patient has no cardiac history known. No blood clot history or risk factors of it. Patient has history of tobacco abuse. Pain currently gone and nonspecific sensation in his right shoulder. No exertional symptoms. No diaphoresis. Patient does feel anxious when he has these intermittent episodes.      Past Medical History:  Diagnosis Date  . Anxiety 06/14/2016  . Essential hypertension 06/14/2016  . Sinus tachycardia 06/14/2016  . Tobacco abuse 06/14/2016    Patient Active Problem List   Diagnosis Date Noted  . Essential hypertension 06/14/2016  . Tobacco abuse 06/14/2016  . Sinus tachycardia 06/14/2016  . Anxiety 06/14/2016    Past Surgical History:  Procedure Laterality Date  . HERNIA REPAIR     pt reports he was around 2 yr        Home Medications    Prior to Admission medications   Medication Sig Start Date End Date Taking? Authorizing Provider  clonazePAM (KLONOPIN) 0.5 MG tablet Take 1 tablet by mouth as needed for anxiety.  06/01/16   [provider]  metoprolol tartrate (LOPRESSOR) 25 MG tablet Take 1 tablet (25 mg total) by mouth as needed. 06/30/16   Chilton Siandolph, Tiffany, MD  sertraline (ZOLOFT) 50 MG tablet Take 1 tablet (50 mg total) by mouth daily. 07/08/16   Chilton Siandolph, Tiffany, MD  SUBOXONE 12-3 MG FILM Place 1 Film under the tongue daily.  05/24/16   [provider]    Family History Family History  Problem Relation Age of Onset  . Arthritis Father   . Lupus Sister   . Autoimmune disease Sister   .  Emphysema Maternal Grandfather   . Cancer Paternal Grandmother     Social History Social History  Substance Use Topics  . Smoking status: Current Every Day Smoker    Packs/day: 1.50    Years: 20.00    Types: Cigarettes  . Smokeless tobacco: Current User  . Alcohol use No     Allergies   Patient has no known allergies.   Review of Systems Review of Systems  Constitutional: Negative for chills and fever.  HENT: Negative for congestion.   Eyes: Negative for visual disturbance.  Respiratory: Negative for shortness of breath.   Cardiovascular: Positive for chest pain. Negative for leg swelling.  Gastrointestinal: Negative for abdominal pain and vomiting.  Genitourinary: Negative for dysuria and flank pain.  Musculoskeletal: Negative for back pain, neck pain and neck stiffness.  Skin: Negative for rash.  Neurological: Negative for light-headedness and headaches.  Psychiatric/Behavioral: The patient is nervous/anxious.      Physical Exam Updated Vital Signs BP 113/70   Pulse 77   Temp 98.3 F (36.8 C) (Oral)   Resp (!) 9   Ht 5\' 10"  (1.778 m)   Wt 105.2 kg (232 lb)   SpO2 97%   BMI 33.29 kg/m   Physical Exam  Constitutional: He is oriented to person, place, and time. He appears well-developed and well-nourished.  HENT:  Head: Normocephalic and atraumatic.  Eyes: Conjunctivae are normal. Right  eye exhibits no discharge. Left eye exhibits no discharge.  Neck: Normal range of motion. Neck supple. No tracheal deviation present.  Cardiovascular: Normal rate and regular rhythm.   Pulmonary/Chest: Effort normal and breath sounds normal.  Abdominal: Soft. He exhibits no distension. There is no tenderness. There is no guarding.  Musculoskeletal: He exhibits no edema.  Neurological: He is alert and oriented to person, place, and time. No cranial nerve deficit.  Skin: Skin is warm. No rash noted.  Psychiatric: His mood appears anxious.  Nursing note and vitals  reviewed.    ED Treatments / Results  Labs (all labs ordered are listed, but only abnormal results are displayed) Labs Reviewed  BASIC METABOLIC PANEL - Abnormal; Notable for the following:       Result Value   Glucose, Bld 131 (*)    All other components within normal limits  CBC  I-STAT TROPOININ, ED    EKG  EKG Interpretation  Date/Time:  Sunday July 10 2016 15:45:48 EDT Ventricular Rate:  100 PR Interval:  162 QRS Duration: 90 QT Interval:  332 QTC Calculation: 428 R Axis:   65 Text Interpretation:  Normal sinus rhythm Normal ECG Confirmed by Leviticus Harton MD, Pollie Poma ([id number]) on 07/10/2016 4:54:45 PM       Radiology No results found.  Procedures Procedures (including critical care time)  Medications Ordered in ED Medications - No data to display   Initial Impression / Assessment and Plan / ED Course  I have reviewed the triage vital signs and the nursing notes.  Pertinent labs & imaging results that were available during my care of the patient were reviewed by me and considered in my medical decision making (see chart for details).    Patient presents with anxiety-like symptoms and atypical chest/right upper arm symptoms. Patient had screening cardiac workup. Patient is low risk heart score. Discussed importance of outpatient follow-up with cardiology and primary doctor. Patient has no blood clot risk factors. Patient's had a d-dimer in the past which is negative for similar presentation. Heart rate normal on recheck.  Results and differential diagnosis were discussed with the patient/parent/guardian. Xrays were independently reviewed by myself.  Close follow up outpatient was discussed, comfortable with the plan.   Medications - No data to display  Vitals:   07/10/16 1543 07/10/16 1700 07/10/16 1730  BP: 130/89 (!) 135/96 113/70  Pulse: (!) 120 90 77  Resp: 18 18 (!) 9  Temp: 98.3 F (36.8 C)    TempSrc: Oral    SpO2: 99% 96% 97%  Weight: 105.2 kg (232 lb)     Height: 5\' 10"  (1.778 m)      Final diagnoses:  Right arm pain     Final Clinical Impressions(s) / ED Diagnoses   Final diagnoses:  Right arm pain    New Prescriptions Discharge Medication List as of 07/10/2016  5:33 PM       Blane Ohara, MD 07/13/16 [phone]

## 2016-07-10 NOTE — ED Triage Notes (Signed)
Pt to ER for evaluation of right arm "weird sensation" that travels up into his chest, onset last night while getting ready to go to bed. Pt does report hx of anxiety. A/o x4. NAD

## 2016-08-01 ENCOUNTER — Telehealth: Payer: Self-pay | Admitting: Cardiovascular Disease

## 2016-08-01 NOTE — Telephone Encounter (Signed)
New message    Pt is calling.   Pt c/o of Chest Pain: STAT if CP now or developed within 24 hours  1. Are you having CP right now? No   2. Are you experiencing any other symptoms (ex. SOB, nausea, vomiting, sweating)? no  3. How long have you been experiencing CP? Off and on for a couple of weeks.  4. Is your CP continuous or coming and going? Comes and goes   5. Have you taken Nitroglycerin? No  ?

## 2016-08-01 NOTE — Telephone Encounter (Signed)
S/w pt states that his CP comes and goes  For the last couple weeks. It lasts only a few seconds pt denies any other sx. He states that it only happens once a day, he states that his BP is running good daily usually 100's/70's and today about 1/2 hour after @230pm  his BP was 99/58 HR 83 and he cannot link it to anything that he is doing. Denies SOB, sweating, radiation, nausea or any other sx. Appt made for 08-08-16. He will cb notified to go to the ER if necessary verbalized understanding.

## 2016-08-08 ENCOUNTER — Encounter: Payer: Self-pay | Admitting: Cardiology

## 2016-08-08 ENCOUNTER — Encounter: Payer: Self-pay | Admitting: Cardiovascular Disease

## 2016-08-08 ENCOUNTER — Ambulatory Visit (INDEPENDENT_AMBULATORY_CARE_PROVIDER_SITE_OTHER): Payer: Managed Care, Other (non HMO) | Admitting: Cardiology

## 2016-08-08 VITALS — BP 130/80 | HR 92 | Ht 70.0 in | Wt 233.0 lb

## 2016-08-08 DIAGNOSIS — I10 Essential (primary) hypertension: Secondary | ICD-10-CM

## 2016-08-08 DIAGNOSIS — R000 Tachycardia, unspecified: Secondary | ICD-10-CM | POA: Diagnosis not present

## 2016-08-08 DIAGNOSIS — F419 Anxiety disorder, unspecified: Secondary | ICD-10-CM | POA: Diagnosis not present

## 2016-08-08 DIAGNOSIS — R079 Chest pain, unspecified: Secondary | ICD-10-CM | POA: Diagnosis not present

## 2016-08-08 NOTE — Patient Instructions (Addendum)
Medication Instructions: No changes   Procedures/Testing: Your physician has requested that you have an echocardiogram. Echocardiography is a painless test that uses sound waves to create images of your heart. It provides your doctor with information about the size and shape of your heart and how well your heart's chambers and valves are working. This procedure takes approximately one hour. There are no restrictions for this procedure. This will be done at [address], [address].  Your physician has requested that you have an exercise tolerance test. For further information please visit [email or web address]. This [address] be done at [address]   Follow-Up: Your physician recommends that you schedule a follow-up appointment with Nicholas ShelterLuke Kilroy, PA after the test have been completed.    If you need a refill on your cardiac medications before your next appointment, please call your pharmacy.

## 2016-08-08 NOTE — Assessment & Plan Note (Signed)
Controlled with beta blocker  

## 2016-08-08 NOTE — Assessment & Plan Note (Signed)
Disabling anxiety, he has been out of work for two months with palpitations, anxiety, and now chest pain. He has been intolerant to Zoloft and Paxil- now on Clonopin PRN

## 2016-08-08 NOTE — Progress Notes (Signed)
08/08/2016 Nicholas Clark   08/10/1980  [id number]  Primary Physician Dulce SellarHudnell, Stephanie, NP Primary Cardiologist: Dr Duke Salviaandolph  HPI:  36 y/o male seen in the office today after an ED visit for chest pain on 07/10/16. He is here with his mother. The pt has had issues with anxiety and palpitations since April. He has had 3 ED visits. This last visit he complained of SSCP. It's not exertional, no associated nausea, diaphoresis, or SOB but he says it "tight" and radiates to hie Lt neck. His symptoms are not exertional and last a "few minutes".    Current Outpatient Prescriptions  Medication Sig Dispense Refill  . clonazePAM (KLONOPIN) 0.5 MG tablet Take 1 tablet by mouth as needed for anxiety.     . metoprolol tartrate (LOPRESSOR) 25 MG tablet Take 1 tablet (25 mg total) by mouth as needed. 90 tablet 1  . SUBOXONE 12-3 MG FILM Place 1 Film under the tongue daily.      No current facility-administered medications for this visit.     No Known Allergies  Past Medical History:  Diagnosis Date  . Anxiety 06/14/2016  . Essential hypertension 06/14/2016  . Sinus tachycardia 06/14/2016  . Tobacco abuse 06/14/2016    Social History   Social History  . Marital status: Single    Spouse name: N/A  . Number of children: N/A  . Years of education: N/A   Occupational History  . Not on file.   Social History Main Topics  . Smoking status: Current Every Day Smoker    Packs/day: 1.50    Years: 20.00    Types: Cigarettes  . Smokeless tobacco: Current User  . Alcohol use No  . Drug use: No  . Sexual activity: Not on file   Other Topics Concern  . Not on file   Social History Narrative  . No narrative on file   Pt has a past history of opioid abuse- he has been on Suboxone for 8-9 years. He denies current opioid use.  Family History  Problem Relation Age of Onset  . Arthritis Father   . Lupus Sister   . Autoimmune disease Sister   . Emphysema Maternal Grandfather   . Cancer Paternal  Grandmother      Review of Systems: General: negative for chills, fever, night sweats or weight changes.  Cardiovascular: negative for dyspnea on exertion, edema, orthopnea, palpitations, paroxysmal nocturnal dyspnea or shortness of breath Dermatological: negative for rash Respiratory: negative for cough or wheezing Urologic: negative for hematuria Abdominal: negative for nausea, vomiting, diarrhea, bright red blood per rectum, melena, or hematemesis Neurologic: negative for visual changes, syncope, or dizziness All other systems reviewed and are otherwise negative except as noted above.    Blood pressure 130/80, pulse 92, height 5\' 10"  (1.778 m), weight 233 lb (105.7 kg).  General appearance: alert, cooperative and no distress Neck: no carotid bruit and no JVD Lungs: clear to auscultation bilaterally Heart: regular rate and rhythm Extremities: extremities normal, atraumatic, no cyanosis or edema Skin: Skin color, texture, turgor normal. No rashes or lesions Neurologic: Grossly normal  EKG NSR, 92  ASSESSMENT AND PLAN:   Chest pain with moderate risk of acute coronary syndrome Pt has developed SSCP "tighness" with radiation to Lt neck  Sinus tachycardia Presumable related to anxiety- on beta blocker, TSH WNL  Anxiety Disabling anxiety, he has been out of work for two months with palpitations, anxiety, and now chest pain. He has been intolerant to Zoloft and Paxil- now  on Clonopin PRN  Essential hypertension Controlled with beta blocker   PLAN  I suggested we proceed with an echo and POET- if these are normal we can reassure him symptoms are most likely non cardiac.   Corine Shelter PA-C 08/08/2016 10:24 AM

## 2016-08-08 NOTE — Assessment & Plan Note (Signed)
Presumable related to anxiety- on beta blocker, TSH WNL

## 2016-08-08 NOTE — Assessment & Plan Note (Signed)
Pt has developed SSCP "tighness" with radiation to Lt neck

## 2016-08-18 ENCOUNTER — Other Ambulatory Visit: Payer: Self-pay

## 2016-08-18 ENCOUNTER — Ambulatory Visit (HOSPITAL_COMMUNITY): Payer: Managed Care, Other (non HMO) | Attending: Cardiology

## 2016-08-18 DIAGNOSIS — R000 Tachycardia, unspecified: Secondary | ICD-10-CM | POA: Diagnosis not present

## 2016-08-18 DIAGNOSIS — R079 Chest pain, unspecified: Secondary | ICD-10-CM | POA: Diagnosis not present

## 2016-08-18 DIAGNOSIS — I10 Essential (primary) hypertension: Secondary | ICD-10-CM | POA: Diagnosis not present

## 2016-08-18 DIAGNOSIS — F17210 Nicotine dependence, cigarettes, uncomplicated: Secondary | ICD-10-CM | POA: Insufficient documentation

## 2016-08-18 MED ORDER — PERFLUTREN LIPID MICROSPHERE
1.0000 mL | INTRAVENOUS | Status: AC | PRN
  Administered 2016-08-18: 1 mL via INTRAVENOUS

## 2016-08-23 ENCOUNTER — Telehealth (HOSPITAL_COMMUNITY): Payer: Self-pay

## 2016-08-23 NOTE — Telephone Encounter (Signed)
Encounter complete. 

## 2016-08-25 ENCOUNTER — Ambulatory Visit (HOSPITAL_COMMUNITY)
Admission: RE | Admit: 2016-08-25 | Discharge: 2016-08-25 | Disposition: A | Discharge status: Home / Self Care | Payer: Managed Care, Other (non HMO) | Source: Ambulatory Visit | Attending: Cardiology | Admitting: Cardiology

## 2016-08-25 DIAGNOSIS — R000 Tachycardia, unspecified: Secondary | ICD-10-CM | POA: Diagnosis not present

## 2016-08-25 DIAGNOSIS — R079 Chest pain, unspecified: Secondary | ICD-10-CM | POA: Insufficient documentation

## 2016-08-25 LAB — EXERCISE TOLERANCE TEST
Estimated workload: 10.1 METS
Exercise duration (min): 8 min
Exercise duration (sec): 36 s
MPHR: 183 {beats}/min
Peak HR: 171 {beats}/min
Percent HR: 93 %
RPE: 17
Rest HR: 107 {beats}/min

## 2016-08-26 ENCOUNTER — Telehealth: Payer: Self-pay | Admitting: Cardiology

## 2016-08-26 NOTE — Telephone Encounter (Signed)
Patient calling, states that he received a missed call from our office. Patient is wondering about results from stress test

## 2016-08-26 NOTE — Telephone Encounter (Signed)
Returned the call back to the patient. There is not any note of someone calling him. He does have an appointment on 7/30. Patient verbalized his understanding.

## 2016-09-05 ENCOUNTER — Ambulatory Visit (INDEPENDENT_AMBULATORY_CARE_PROVIDER_SITE_OTHER): Payer: Managed Care, Other (non HMO) | Admitting: Cardiology

## 2016-09-05 ENCOUNTER — Encounter: Payer: Self-pay | Admitting: Cardiology

## 2016-09-05 DIAGNOSIS — R079 Chest pain, unspecified: Secondary | ICD-10-CM

## 2016-09-05 DIAGNOSIS — F419 Anxiety disorder, unspecified: Secondary | ICD-10-CM

## 2016-09-05 DIAGNOSIS — Z720 Tobacco use: Secondary | ICD-10-CM | POA: Diagnosis not present

## 2016-09-05 DIAGNOSIS — R002 Palpitations: Secondary | ICD-10-CM | POA: Insufficient documentation

## 2016-09-05 NOTE — Assessment & Plan Note (Signed)
Negative stress (POET), normal echo

## 2016-09-05 NOTE — Assessment & Plan Note (Signed)
Beta blocker PRN

## 2016-09-05 NOTE — Assessment & Plan Note (Signed)
Still smoking

## 2016-09-05 NOTE — Assessment & Plan Note (Signed)
Primary issue

## 2016-09-05 NOTE — Patient Instructions (Signed)
Your physician recommends that you continue on your current medications as directed. Please refer to the Current Medication list given to you today.    Your physician recommends that you schedule a follow-up appointment in:  AS NEEDED 

## 2016-09-05 NOTE — Progress Notes (Signed)
09/05/2016 Nicholas Clark   12/13/1980  [id number]  Primary Physician Nicholas Clark, Stephanie, NP Primary Cardiologist: Nicholas Clark  HPI:  36 y/o male seen in the office today accompanied by his father for f/u of his recent tests. The pt saw Nicholas Clark 06/13/16 after an ED visit for chest pain on 06/09/16. The pt has had issues with anxiety and palpitations since April. He has had problems tolerating anti anxiety medications. He had 3 ED visits this Spring. In May he complained of SSCP "tighness". Nicholas Clark saw him as a post-ED visit evaluation. She adjusted adjusted his medications for HTN and resting tachycardia. I saw the pt 08/08/16. He was still complaining of chest "tightness". I ordered a POET and an echo, both of which were normal. He is in the office today with his father. The pt continues to have intermittent "tightness" not related to activity. Klonopin helps. He denies any associated N/V/D.     Current Outpatient Prescriptions  Medication Sig Dispense Refill  . clonazePAM (KLONOPIN) 0.5 MG tablet Take 1 tablet by mouth as needed for anxiety.     . metoprolol tartrate (LOPRESSOR) 25 MG tablet Take 1 tablet (25 mg total) by mouth as needed. 90 tablet 1  . SUBOXONE 12-3 MG FILM Place 1 Film under the tongue daily.      No current facility-administered medications for this visit.     No Known Allergies  Past Medical History:  Diagnosis Date  . Anxiety 06/14/2016  . Essential hypertension 06/14/2016  . Sinus tachycardia 06/14/2016  . Tobacco abuse 06/14/2016    Social History   Social History  . Marital status: Single    Spouse name: N/A  . Number of children: N/A  . Years of education: N/A   Occupational History  . Not on file.   Social History Main Topics  . Smoking status: Current Every Day Smoker    Packs/day: 1.50    Years: 20.00    Types: Cigarettes  . Smokeless tobacco: Current User  . Alcohol use No  . Drug use: No  . Sexual activity: Not on file   Other Topics Concern    . Not on file   Social History Narrative  . No narrative on file     Family History  Problem Relation Age of Onset  . Arthritis Father   . Lupus Sister   . Autoimmune disease Sister   . Emphysema Maternal Grandfather   . Cancer Paternal Grandmother      Review of Systems: General: negative for chills, fever, night sweats or weight changes.  Cardiovascular: negative for chest pain, dyspnea on exertion, edema, orthopnea, palpitations, paroxysmal nocturnal dyspnea or shortness of breath Dermatological: negative for rash Respiratory: negative for cough or wheezing Urologic: negative for hematuria Abdominal: negative for nausea, vomiting, diarrhea, bright red blood per rectum, melena, or hematemesis Neurologic: negative for visual changes, syncope, or dizziness All other systems reviewed and are otherwise negative except as noted above.    Blood pressure 122/90, pulse 90, height 5\' 10"  (1.778 m), weight 233 lb (105.7 kg).  General appearance: alert, cooperative and no distress Neck: no carotid bruit and no JVD Lungs: clear to auscultation bilaterally Heart: regular rate and rhythm Extremities: extremities normal, atraumatic, no cyanosis or edema Skin: Skin color, texture, turgor normal. No rashes or lesions Neurologic: Grossly normal   ASSESSMENT AND PLAN:   Chest pain with low risk for cardiac etiology Negative stress (POET), normal echo  Anxiety Primary issue  Tobacco abuse Still  smoking  Palpitations Beta blocker PRN   PLAN  Both the pt and his father were reassured that his test were normal. I told them they could f/u PRN. If he has continued chest pain or increased palpitations he'll contact Nicholas Clark. I encouraged him to keep working with his PCP regarding his anxiety.    Nicholas ShelterLuke Chizara Mena PA-C 09/05/2016 10:18 AM

## 2016-10-11 ENCOUNTER — Ambulatory Visit: Payer: Managed Care, Other (non HMO) | Admitting: Cardiovascular Disease

## 2018-01-17 IMAGING — DX DG CHEST 2V
2 series · 2 of 2 positions shown · non-contrast
Comparison: None.

CLINICAL DATA: Intermittent chest pain tonight, radiating down the
left arm.

EXAM:
CHEST  2 VIEW

[chest pa]
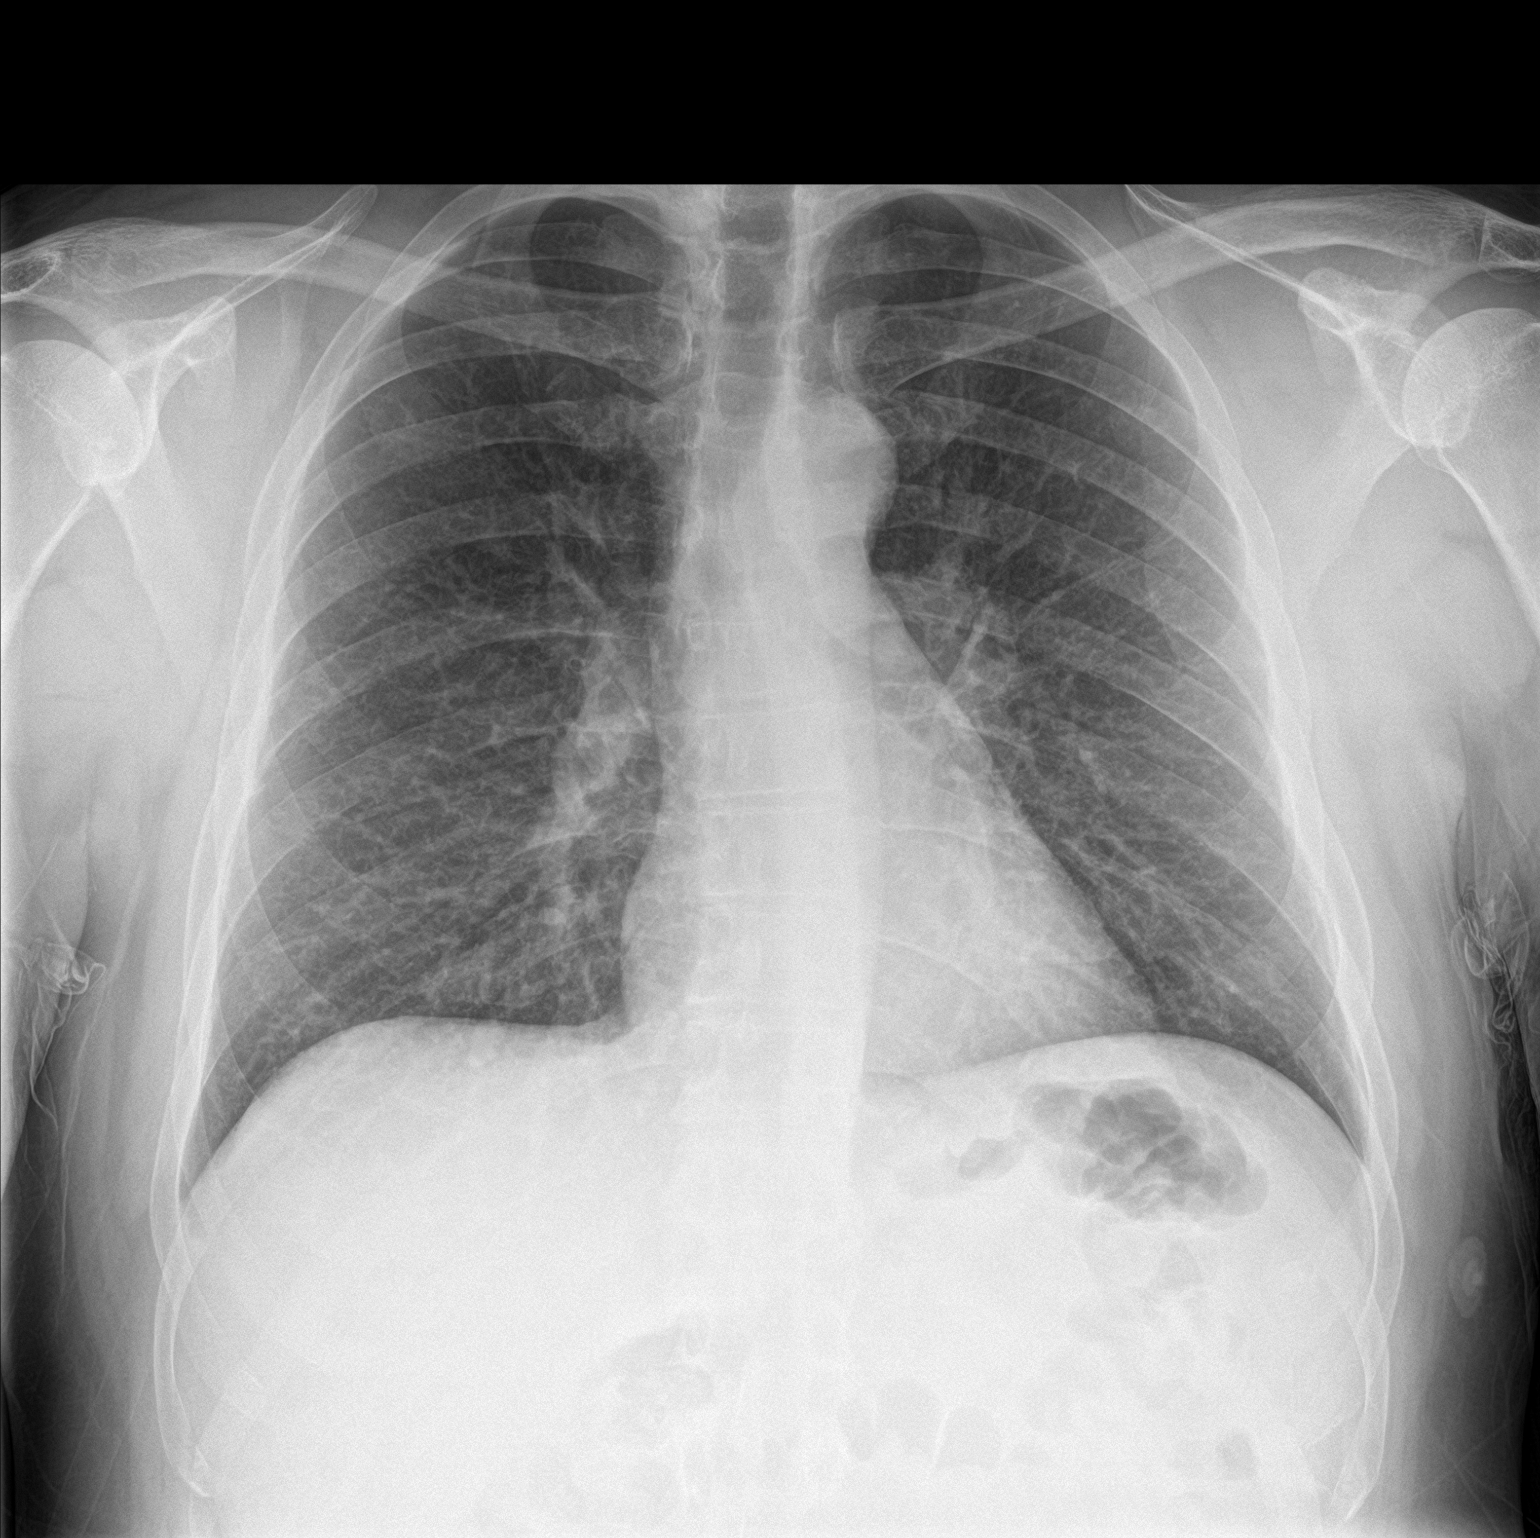

[chest lat]
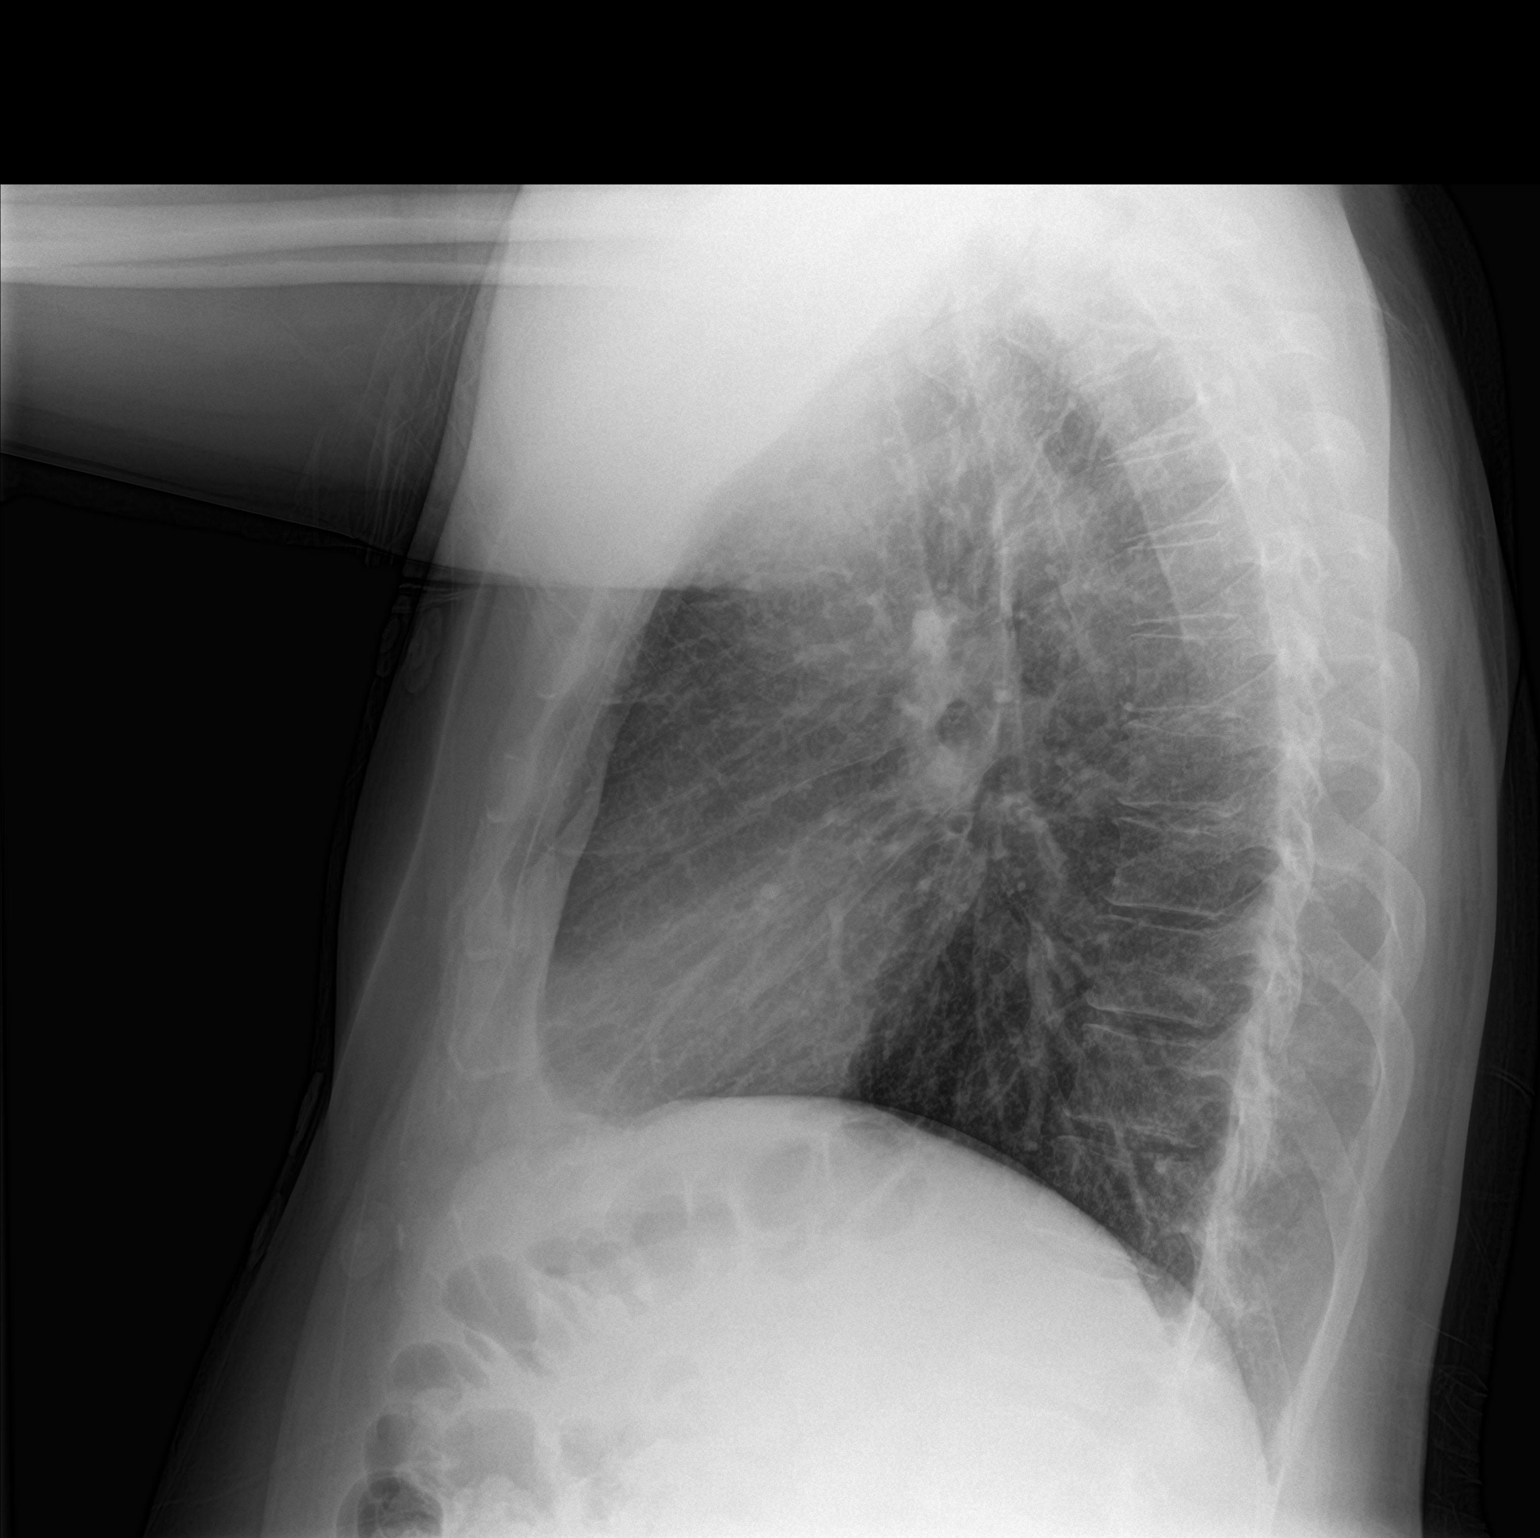

[2 of 2 positions shown; findings below may reference images not displayed]

FINDINGS: The lungs are clear. The pulmonary vasculature is normal. Heart size
is normal. Hilar and mediastinal contours are unremarkable. There is
no pleural effusion.
IMPRESSION: No active cardiopulmonary disease.

## 2018-02-17 IMAGING — CR DG CHEST 2V
2 series · 2 of 2 positions shown · non-contrast
Comparison: 06/09/2016

CLINICAL DATA: Right arm and shoulder pain for 2 days, initial
encounter

EXAM:
CHEST  2 VIEW

[chest pa]
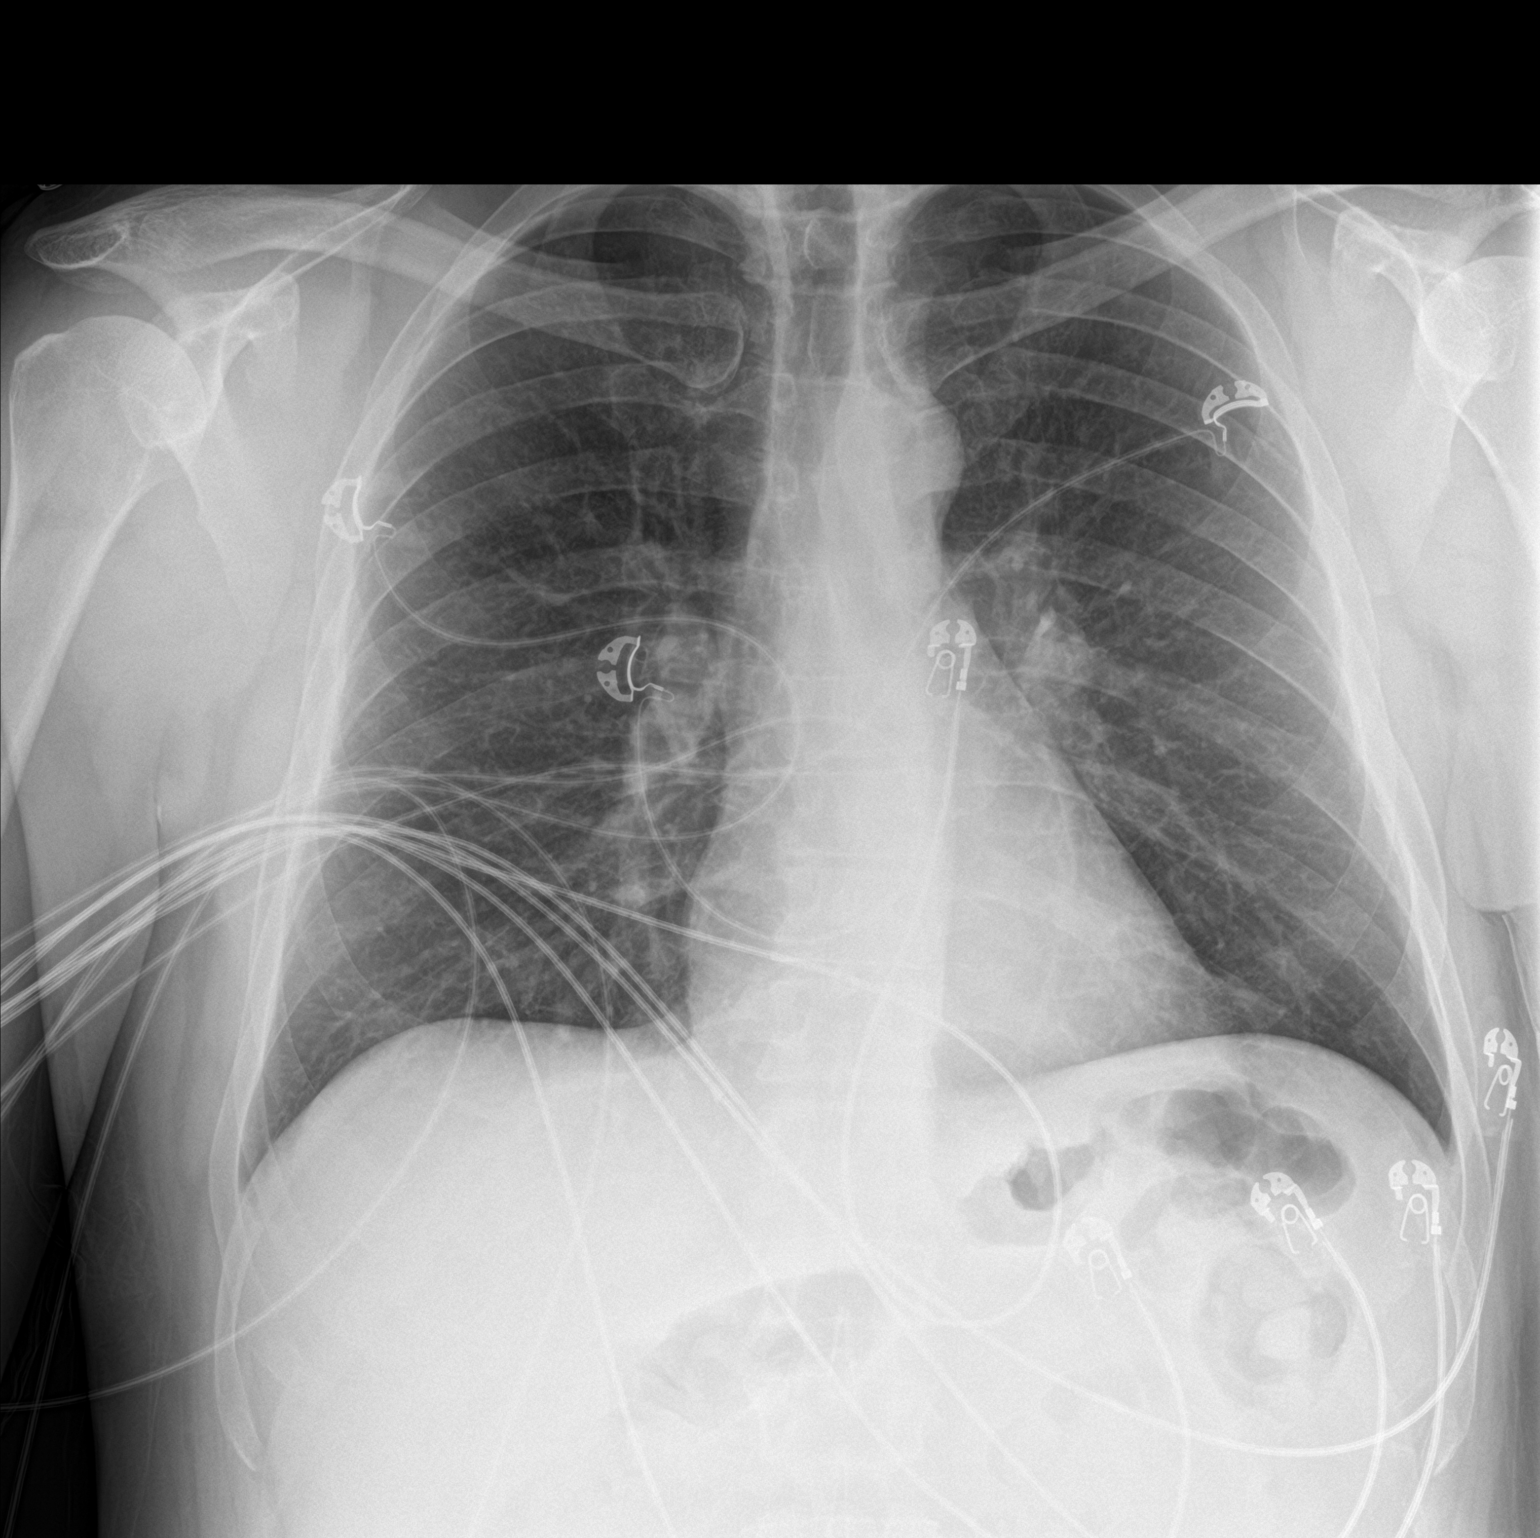

[chest lat]
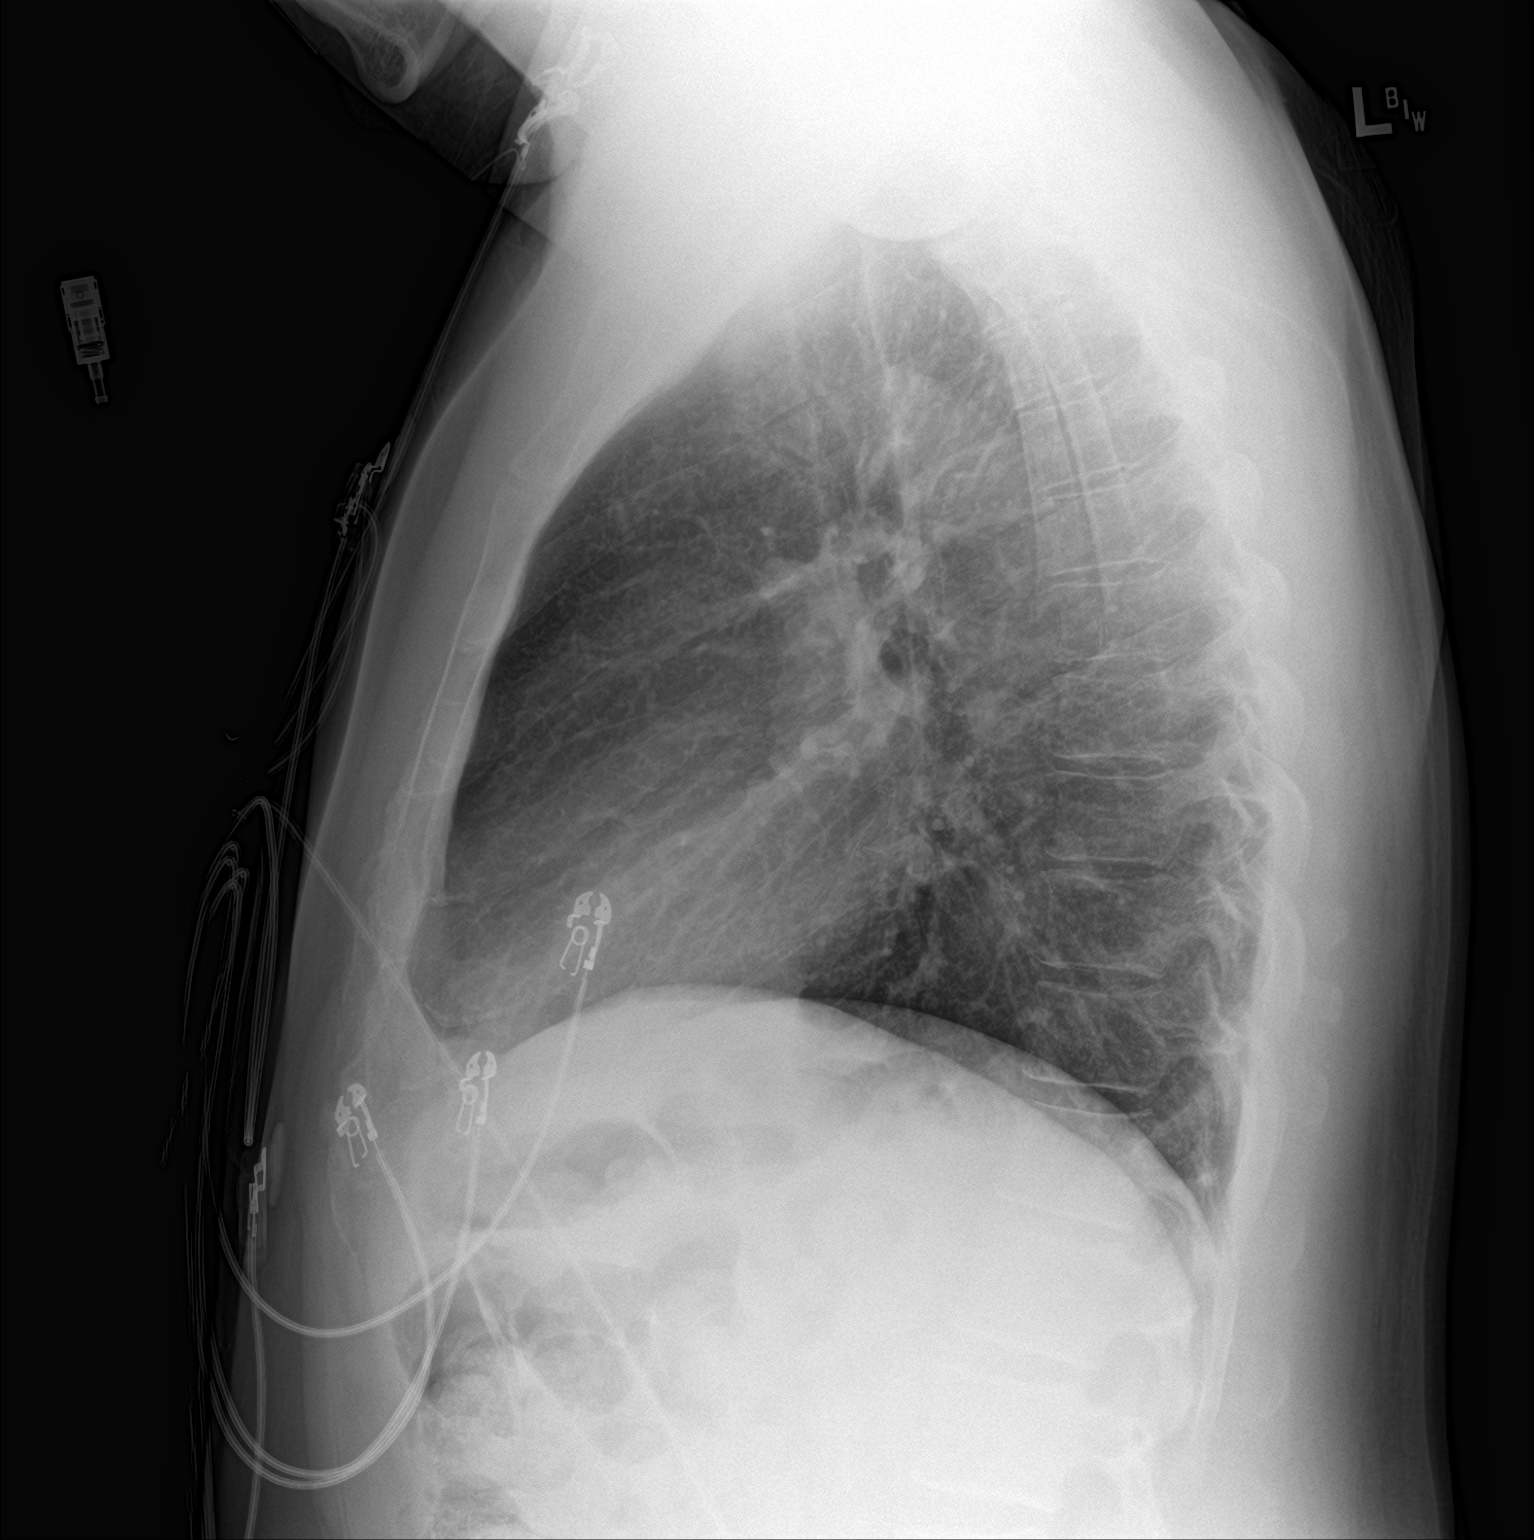

[2 of 2 positions shown; findings below may reference images not displayed]

FINDINGS: The heart size and mediastinal contours are within normal limits.
Both lungs are clear. The visualized skeletal structures are
unremarkable.
IMPRESSION: No active cardiopulmonary disease.

## 2019-12-27 DIAGNOSIS — Z6832 Body mass index (BMI) 32.0-32.9, adult: Secondary | ICD-10-CM | POA: Diagnosis not present

## 2019-12-27 DIAGNOSIS — F1121 Opioid dependence, in remission: Secondary | ICD-10-CM | POA: Diagnosis not present

## 2019-12-27 DIAGNOSIS — Z79899 Other long term (current) drug therapy: Secondary | ICD-10-CM | POA: Diagnosis not present

## 2019-12-27 DIAGNOSIS — F331 Major depressive disorder, recurrent, moderate: Secondary | ICD-10-CM | POA: Diagnosis not present

## 2020-02-21 DIAGNOSIS — Z79899 Other long term (current) drug therapy: Secondary | ICD-10-CM | POA: Diagnosis not present

## 2020-02-21 DIAGNOSIS — F331 Major depressive disorder, recurrent, moderate: Secondary | ICD-10-CM | POA: Diagnosis not present

## 2020-02-21 DIAGNOSIS — Z6832 Body mass index (BMI) 32.0-32.9, adult: Secondary | ICD-10-CM | POA: Diagnosis not present

## 2020-02-21 DIAGNOSIS — F1121 Opioid dependence, in remission: Secondary | ICD-10-CM | POA: Diagnosis not present

## 2020-02-22 DIAGNOSIS — F331 Major depressive disorder, recurrent, moderate: Secondary | ICD-10-CM | POA: Diagnosis not present

## 2020-02-22 DIAGNOSIS — F411 Generalized anxiety disorder: Secondary | ICD-10-CM | POA: Diagnosis not present

## 2020-02-22 DIAGNOSIS — F1124 Opioid dependence with opioid-induced mood disorder: Secondary | ICD-10-CM | POA: Diagnosis not present

## 2020-03-09 DIAGNOSIS — R509 Fever, unspecified: Secondary | ICD-10-CM | POA: Diagnosis not present

## 2020-03-09 DIAGNOSIS — Z20828 Contact with and (suspected) exposure to other viral communicable diseases: Secondary | ICD-10-CM | POA: Diagnosis not present

## 2020-04-21 DIAGNOSIS — F331 Major depressive disorder, recurrent, moderate: Secondary | ICD-10-CM | POA: Diagnosis not present

## 2020-04-21 DIAGNOSIS — Z6832 Body mass index (BMI) 32.0-32.9, adult: Secondary | ICD-10-CM | POA: Diagnosis not present

## 2020-04-21 DIAGNOSIS — F1121 Opioid dependence, in remission: Secondary | ICD-10-CM | POA: Diagnosis not present

## 2020-04-21 DIAGNOSIS — Z79899 Other long term (current) drug therapy: Secondary | ICD-10-CM | POA: Diagnosis not present

## 2020-06-19 DIAGNOSIS — F1121 Opioid dependence, in remission: Secondary | ICD-10-CM | POA: Diagnosis not present

## 2020-06-19 DIAGNOSIS — Z79899 Other long term (current) drug therapy: Secondary | ICD-10-CM | POA: Diagnosis not present

## 2020-06-19 DIAGNOSIS — Z6832 Body mass index (BMI) 32.0-32.9, adult: Secondary | ICD-10-CM | POA: Diagnosis not present

## 2020-06-19 DIAGNOSIS — F331 Major depressive disorder, recurrent, moderate: Secondary | ICD-10-CM | POA: Diagnosis not present

## 2020-08-19 DIAGNOSIS — F1121 Opioid dependence, in remission: Secondary | ICD-10-CM | POA: Diagnosis not present

## 2020-08-19 DIAGNOSIS — Z6832 Body mass index (BMI) 32.0-32.9, adult: Secondary | ICD-10-CM | POA: Diagnosis not present

## 2020-08-19 DIAGNOSIS — Z79899 Other long term (current) drug therapy: Secondary | ICD-10-CM | POA: Diagnosis not present

## 2020-08-19 DIAGNOSIS — F331 Major depressive disorder, recurrent, moderate: Secondary | ICD-10-CM | POA: Diagnosis not present

## 2020-09-14 DIAGNOSIS — F1124 Opioid dependence with opioid-induced mood disorder: Secondary | ICD-10-CM | POA: Diagnosis not present

## 2020-09-14 DIAGNOSIS — F411 Generalized anxiety disorder: Secondary | ICD-10-CM | POA: Diagnosis not present

## 2020-10-20 DIAGNOSIS — Z6832 Body mass index (BMI) 32.0-32.9, adult: Secondary | ICD-10-CM | POA: Diagnosis not present

## 2020-10-20 DIAGNOSIS — Z79899 Other long term (current) drug therapy: Secondary | ICD-10-CM | POA: Diagnosis not present

## 2020-10-20 DIAGNOSIS — F331 Major depressive disorder, recurrent, moderate: Secondary | ICD-10-CM | POA: Diagnosis not present

## 2020-10-20 DIAGNOSIS — F1121 Opioid dependence, in remission: Secondary | ICD-10-CM | POA: Diagnosis not present

## 2020-12-21 DIAGNOSIS — Z6832 Body mass index (BMI) 32.0-32.9, adult: Secondary | ICD-10-CM | POA: Diagnosis not present

## 2020-12-21 DIAGNOSIS — Z79899 Other long term (current) drug therapy: Secondary | ICD-10-CM | POA: Diagnosis not present

## 2020-12-21 DIAGNOSIS — F1120 Opioid dependence, uncomplicated: Secondary | ICD-10-CM | POA: Diagnosis not present

## 2020-12-21 DIAGNOSIS — F331 Major depressive disorder, recurrent, moderate: Secondary | ICD-10-CM | POA: Diagnosis not present

## 2021-02-18 DIAGNOSIS — Z79891 Long term (current) use of opiate analgesic: Secondary | ICD-10-CM | POA: Diagnosis not present

## 2021-02-18 DIAGNOSIS — F1124 Opioid dependence with opioid-induced mood disorder: Secondary | ICD-10-CM | POA: Diagnosis not present

## 2021-02-18 DIAGNOSIS — Z79899 Other long term (current) drug therapy: Secondary | ICD-10-CM | POA: Diagnosis not present

## 2021-02-19 DIAGNOSIS — F331 Major depressive disorder, recurrent, moderate: Secondary | ICD-10-CM | POA: Diagnosis not present

## 2021-02-19 DIAGNOSIS — F1120 Opioid dependence, uncomplicated: Secondary | ICD-10-CM | POA: Diagnosis not present

## 2021-02-19 DIAGNOSIS — Z6832 Body mass index (BMI) 32.0-32.9, adult: Secondary | ICD-10-CM | POA: Diagnosis not present

## 2021-03-13 DIAGNOSIS — F1124 Opioid dependence with opioid-induced mood disorder: Secondary | ICD-10-CM | POA: Diagnosis not present

## 2021-03-13 DIAGNOSIS — F411 Generalized anxiety disorder: Secondary | ICD-10-CM | POA: Diagnosis not present

## 2021-04-20 DIAGNOSIS — Z6832 Body mass index (BMI) 32.0-32.9, adult: Secondary | ICD-10-CM | POA: Diagnosis not present

## 2021-04-20 DIAGNOSIS — Z79899 Other long term (current) drug therapy: Secondary | ICD-10-CM | POA: Diagnosis not present

## 2021-04-20 DIAGNOSIS — F331 Major depressive disorder, recurrent, moderate: Secondary | ICD-10-CM | POA: Diagnosis not present

## 2021-04-20 DIAGNOSIS — F1120 Opioid dependence, uncomplicated: Secondary | ICD-10-CM | POA: Diagnosis not present

## 2021-06-17 DIAGNOSIS — Z79899 Other long term (current) drug therapy: Secondary | ICD-10-CM | POA: Diagnosis not present

## 2021-06-17 DIAGNOSIS — F1120 Opioid dependence, uncomplicated: Secondary | ICD-10-CM | POA: Diagnosis not present

## 2021-06-17 DIAGNOSIS — Z6832 Body mass index (BMI) 32.0-32.9, adult: Secondary | ICD-10-CM | POA: Diagnosis not present

## 2021-06-17 DIAGNOSIS — F331 Major depressive disorder, recurrent, moderate: Secondary | ICD-10-CM | POA: Diagnosis not present

## 2021-08-17 DIAGNOSIS — F331 Major depressive disorder, recurrent, moderate: Secondary | ICD-10-CM | POA: Diagnosis not present

## 2021-08-17 DIAGNOSIS — F1120 Opioid dependence, uncomplicated: Secondary | ICD-10-CM | POA: Diagnosis not present

## 2021-08-17 DIAGNOSIS — Z6832 Body mass index (BMI) 32.0-32.9, adult: Secondary | ICD-10-CM | POA: Diagnosis not present

## 2021-08-17 DIAGNOSIS — Z79899 Other long term (current) drug therapy: Secondary | ICD-10-CM | POA: Diagnosis not present

## 2021-09-20 DIAGNOSIS — F1124 Opioid dependence with opioid-induced mood disorder: Secondary | ICD-10-CM | POA: Diagnosis not present

## 2021-09-20 DIAGNOSIS — F411 Generalized anxiety disorder: Secondary | ICD-10-CM | POA: Diagnosis not present

## 2021-10-15 DIAGNOSIS — F331 Major depressive disorder, recurrent, moderate: Secondary | ICD-10-CM | POA: Diagnosis not present

## 2021-10-15 DIAGNOSIS — F1120 Opioid dependence, uncomplicated: Secondary | ICD-10-CM | POA: Diagnosis not present

## 2021-10-15 DIAGNOSIS — Z79899 Other long term (current) drug therapy: Secondary | ICD-10-CM | POA: Diagnosis not present

## 2021-10-15 DIAGNOSIS — Z6832 Body mass index (BMI) 32.0-32.9, adult: Secondary | ICD-10-CM | POA: Diagnosis not present

## 2021-12-01 DIAGNOSIS — R0789 Other chest pain: Secondary | ICD-10-CM | POA: Diagnosis not present

## 2021-12-01 DIAGNOSIS — F419 Anxiety disorder, unspecified: Secondary | ICD-10-CM | POA: Diagnosis not present

## 2021-12-10 DIAGNOSIS — F331 Major depressive disorder, recurrent, moderate: Secondary | ICD-10-CM | POA: Diagnosis not present

## 2021-12-10 DIAGNOSIS — F1120 Opioid dependence, uncomplicated: Secondary | ICD-10-CM | POA: Diagnosis not present

## 2021-12-10 DIAGNOSIS — Z79899 Other long term (current) drug therapy: Secondary | ICD-10-CM | POA: Diagnosis not present

## 2021-12-10 DIAGNOSIS — Z6832 Body mass index (BMI) 32.0-32.9, adult: Secondary | ICD-10-CM | POA: Diagnosis not present

## 2022-02-08 DIAGNOSIS — F1120 Opioid dependence, uncomplicated: Secondary | ICD-10-CM | POA: Diagnosis not present

## 2022-02-08 DIAGNOSIS — F331 Major depressive disorder, recurrent, moderate: Secondary | ICD-10-CM | POA: Diagnosis not present

## 2022-02-08 DIAGNOSIS — Z6832 Body mass index (BMI) 32.0-32.9, adult: Secondary | ICD-10-CM | POA: Diagnosis not present

## 2022-03-25 DIAGNOSIS — Z79899 Other long term (current) drug therapy: Secondary | ICD-10-CM | POA: Diagnosis not present

## 2022-03-25 DIAGNOSIS — Z79891 Long term (current) use of opiate analgesic: Secondary | ICD-10-CM | POA: Diagnosis not present

## 2022-03-25 DIAGNOSIS — F1124 Opioid dependence with opioid-induced mood disorder: Secondary | ICD-10-CM | POA: Diagnosis not present

## 2022-03-25 DIAGNOSIS — F411 Generalized anxiety disorder: Secondary | ICD-10-CM | POA: Diagnosis not present

## 2022-04-05 DIAGNOSIS — Z6832 Body mass index (BMI) 32.0-32.9, adult: Secondary | ICD-10-CM | POA: Diagnosis not present

## 2022-04-05 DIAGNOSIS — F1120 Opioid dependence, uncomplicated: Secondary | ICD-10-CM | POA: Diagnosis not present

## 2022-04-05 DIAGNOSIS — F331 Major depressive disorder, recurrent, moderate: Secondary | ICD-10-CM | POA: Diagnosis not present

## 2022-04-05 DIAGNOSIS — Z79899 Other long term (current) drug therapy: Secondary | ICD-10-CM | POA: Diagnosis not present

## 2022-05-31 DIAGNOSIS — Z6832 Body mass index (BMI) 32.0-32.9, adult: Secondary | ICD-10-CM | POA: Diagnosis not present

## 2022-05-31 DIAGNOSIS — F331 Major depressive disorder, recurrent, moderate: Secondary | ICD-10-CM | POA: Diagnosis not present

## 2022-05-31 DIAGNOSIS — F1120 Opioid dependence, uncomplicated: Secondary | ICD-10-CM | POA: Diagnosis not present

## 2022-07-21 DIAGNOSIS — Z6831 Body mass index (BMI) 31.0-31.9, adult: Secondary | ICD-10-CM | POA: Diagnosis not present

## 2022-07-21 DIAGNOSIS — F1120 Opioid dependence, uncomplicated: Secondary | ICD-10-CM | POA: Diagnosis not present

## 2022-07-21 DIAGNOSIS — Z79899 Other long term (current) drug therapy: Secondary | ICD-10-CM | POA: Diagnosis not present

## 2022-07-21 DIAGNOSIS — F331 Major depressive disorder, recurrent, moderate: Secondary | ICD-10-CM | POA: Diagnosis not present

## 2022-08-29 DIAGNOSIS — F1124 Opioid dependence with opioid-induced mood disorder: Secondary | ICD-10-CM | POA: Diagnosis not present

## 2022-08-29 DIAGNOSIS — F411 Generalized anxiety disorder: Secondary | ICD-10-CM | POA: Diagnosis not present

## 2022-09-15 DIAGNOSIS — F1120 Opioid dependence, uncomplicated: Secondary | ICD-10-CM | POA: Diagnosis not present

## 2022-09-15 DIAGNOSIS — F331 Major depressive disorder, recurrent, moderate: Secondary | ICD-10-CM | POA: Diagnosis not present

## 2022-11-10 DIAGNOSIS — F331 Major depressive disorder, recurrent, moderate: Secondary | ICD-10-CM | POA: Diagnosis not present

## 2022-11-10 DIAGNOSIS — Z79899 Other long term (current) drug therapy: Secondary | ICD-10-CM | POA: Diagnosis not present

## 2022-11-10 DIAGNOSIS — F1120 Opioid dependence, uncomplicated: Secondary | ICD-10-CM | POA: Diagnosis not present

## 2022-11-10 DIAGNOSIS — Z6831 Body mass index (BMI) 31.0-31.9, adult: Secondary | ICD-10-CM | POA: Diagnosis not present

## 2022-12-06 DIAGNOSIS — F1124 Opioid dependence with opioid-induced mood disorder: Secondary | ICD-10-CM | POA: Diagnosis not present

## 2022-12-06 DIAGNOSIS — F411 Generalized anxiety disorder: Secondary | ICD-10-CM | POA: Diagnosis not present

## 2023-01-04 DIAGNOSIS — F1120 Opioid dependence, uncomplicated: Secondary | ICD-10-CM | POA: Diagnosis not present

## 2023-01-04 DIAGNOSIS — Z6831 Body mass index (BMI) 31.0-31.9, adult: Secondary | ICD-10-CM | POA: Diagnosis not present

## 2023-01-04 DIAGNOSIS — F331 Major depressive disorder, recurrent, moderate: Secondary | ICD-10-CM | POA: Diagnosis not present

## 2023-01-04 DIAGNOSIS — Z79899 Other long term (current) drug therapy: Secondary | ICD-10-CM | POA: Diagnosis not present

## 2023-03-01 DIAGNOSIS — F1120 Opioid dependence, uncomplicated: Secondary | ICD-10-CM | POA: Diagnosis not present

## 2023-03-01 DIAGNOSIS — Z6829 Body mass index (BMI) 29.0-29.9, adult: Secondary | ICD-10-CM | POA: Diagnosis not present

## 2023-03-01 DIAGNOSIS — F331 Major depressive disorder, recurrent, moderate: Secondary | ICD-10-CM | POA: Diagnosis not present

## 2023-03-07 DIAGNOSIS — F1124 Opioid dependence with opioid-induced mood disorder: Secondary | ICD-10-CM | POA: Diagnosis not present

## 2023-03-07 DIAGNOSIS — F411 Generalized anxiety disorder: Secondary | ICD-10-CM | POA: Diagnosis not present

## 2023-07-06 DIAGNOSIS — E663 Overweight: Secondary | ICD-10-CM | POA: Diagnosis not present

## 2023-07-06 DIAGNOSIS — Z6829 Body mass index (BMI) 29.0-29.9, adult: Secondary | ICD-10-CM | POA: Diagnosis not present

## 2023-07-06 DIAGNOSIS — F1124 Opioid dependence with opioid-induced mood disorder: Secondary | ICD-10-CM | POA: Diagnosis not present

## 2023-07-06 DIAGNOSIS — F411 Generalized anxiety disorder: Secondary | ICD-10-CM | POA: Diagnosis not present

## 2023-07-06 DIAGNOSIS — F1120 Opioid dependence, uncomplicated: Secondary | ICD-10-CM | POA: Diagnosis not present

## 2023-07-06 DIAGNOSIS — Z79899 Other long term (current) drug therapy: Secondary | ICD-10-CM | POA: Diagnosis not present

## 2023-07-06 DIAGNOSIS — F331 Major depressive disorder, recurrent, moderate: Secondary | ICD-10-CM | POA: Diagnosis not present

## 2023-08-31 DIAGNOSIS — F1120 Opioid dependence, uncomplicated: Secondary | ICD-10-CM | POA: Diagnosis not present

## 2023-08-31 DIAGNOSIS — E663 Overweight: Secondary | ICD-10-CM | POA: Diagnosis not present

## 2023-08-31 DIAGNOSIS — F331 Major depressive disorder, recurrent, moderate: Secondary | ICD-10-CM | POA: Diagnosis not present

## 2023-08-31 DIAGNOSIS — Z6829 Body mass index (BMI) 29.0-29.9, adult: Secondary | ICD-10-CM | POA: Diagnosis not present

## 2023-08-31 DIAGNOSIS — Z79899 Other long term (current) drug therapy: Secondary | ICD-10-CM | POA: Diagnosis not present

## 2023-10-23 DIAGNOSIS — F411 Generalized anxiety disorder: Secondary | ICD-10-CM | POA: Diagnosis not present

## 2023-10-23 DIAGNOSIS — F1124 Opioid dependence with opioid-induced mood disorder: Secondary | ICD-10-CM | POA: Diagnosis not present

## 2023-10-26 DIAGNOSIS — F1120 Opioid dependence, uncomplicated: Secondary | ICD-10-CM | POA: Diagnosis not present

## 2023-10-26 DIAGNOSIS — F331 Major depressive disorder, recurrent, moderate: Secondary | ICD-10-CM | POA: Diagnosis not present

## 2023-10-26 DIAGNOSIS — Z6829 Body mass index (BMI) 29.0-29.9, adult: Secondary | ICD-10-CM | POA: Diagnosis not present

## 2023-12-21 DIAGNOSIS — Z79899 Other long term (current) drug therapy: Secondary | ICD-10-CM | POA: Diagnosis not present

## 2023-12-21 DIAGNOSIS — F1120 Opioid dependence, uncomplicated: Secondary | ICD-10-CM | POA: Diagnosis not present

## 2023-12-21 DIAGNOSIS — Z6829 Body mass index (BMI) 29.0-29.9, adult: Secondary | ICD-10-CM | POA: Diagnosis not present

## 2023-12-21 DIAGNOSIS — F331 Major depressive disorder, recurrent, moderate: Secondary | ICD-10-CM | POA: Diagnosis not present
